# Patient Record
Sex: Female | Born: 1960 | Race: White | Hispanic: No | Marital: Married | Smoking: Never smoker
Health system: Southern US, Community
[De-identification: ages and names within clinical notes are randomized; demographics above are authoritative.]

## PROBLEM LIST (undated history)

## (undated) DIAGNOSIS — R112 Nausea with vomiting, unspecified: Secondary | ICD-10-CM

## (undated) DIAGNOSIS — C189 Malignant neoplasm of colon, unspecified: Secondary | ICD-10-CM

## (undated) DIAGNOSIS — L80 Vitiligo: Secondary | ICD-10-CM

## (undated) DIAGNOSIS — Z9889 Other specified postprocedural states: Secondary | ICD-10-CM

## (undated) HISTORY — PX: ABDOMINAL HYSTERECTOMY: SHX81

## (undated) HISTORY — PX: COLON SURGERY: SHX602

## (undated) HISTORY — PX: COLONOSCOPY: SHX174

---

## 2007-04-15 ENCOUNTER — Ambulatory Visit: Payer: Self-pay | Admitting: Family Medicine

## 2007-04-15 DIAGNOSIS — Z9189 Other specified personal risk factors, not elsewhere classified: Secondary | ICD-10-CM | POA: Insufficient documentation

## 2007-04-15 LAB — CONVERTED CEMR LAB
Blood in Urine, dipstick: NEGATIVE
Glucose, Urine, Semiquant: NEGATIVE
Protein, U semiquant: NEGATIVE
Urobilinogen, UA: 0.2
pH: 5.5

## 2007-04-17 LAB — CONVERTED CEMR LAB
Albumin: 3.9 g/dL (ref 3.5–5.2)
Alkaline Phosphatase: 48 units/L (ref 39–117)
Basophils Absolute: 0 10*3/uL (ref 0.0–0.1)
Bilirubin, Direct: 0.1 mg/dL (ref 0.0–0.3)
CO2: 31 meq/L (ref 19–32)
Creatinine, Ser: 0.7 mg/dL (ref 0.4–1.2)
Eosinophils Relative: 4 % (ref 0.0–5.0)
LDL Cholesterol: 93 mg/dL (ref 0–99)
MCV: 91.2 fL (ref 78.0–100.0)
Neutro Abs: 3.1 10*3/uL (ref 1.4–7.7)
Platelets: 255 10*3/uL (ref 150–400)
Potassium: 4.6 meq/L (ref 3.5–5.1)
RBC: 4.52 M/uL (ref 3.87–5.11)
Sodium: 140 meq/L (ref 135–145)
TSH: 2.02 microintl units/mL (ref 0.35–5.50)
Total Bilirubin: 0.6 mg/dL (ref 0.3–1.2)
Total CHOL/HDL Ratio: 3.6
Total Protein: 7 g/dL (ref 6.0–8.3)
VLDL: 8 mg/dL (ref 0–40)
WBC: 4.8 10*3/uL (ref 4.5–10.5)

## 2007-05-23 ENCOUNTER — Ambulatory Visit: Payer: Self-pay | Admitting: Family Medicine

## 2007-09-25 ENCOUNTER — Telehealth: Payer: Self-pay | Admitting: Family Medicine

## 2008-02-16 ENCOUNTER — Ambulatory Visit: Payer: Self-pay | Admitting: Family Medicine

## 2008-02-16 DIAGNOSIS — D259 Leiomyoma of uterus, unspecified: Secondary | ICD-10-CM | POA: Insufficient documentation

## 2008-02-16 DIAGNOSIS — K602 Anal fissure, unspecified: Secondary | ICD-10-CM | POA: Insufficient documentation

## 2008-03-30 ENCOUNTER — Encounter: Admission: RE | Admit: 2008-03-30 | Discharge: 2008-03-30 | Payer: Self-pay | Admitting: Obstetrics and Gynecology

## 2008-06-09 ENCOUNTER — Encounter: Admission: RE | Admit: 2008-06-09 | Discharge: 2008-06-09 | Payer: Self-pay | Admitting: Obstetrics and Gynecology

## 2009-08-10 ENCOUNTER — Encounter: Admission: RE | Admit: 2009-08-10 | Discharge: 2009-08-10 | Payer: Self-pay | Admitting: Obstetrics and Gynecology

## 2010-07-24 ENCOUNTER — Ambulatory Visit
Admission: RE | Admit: 2010-07-24 | Discharge: 2010-07-24 | Payer: Self-pay | Source: Home / Self Care | Attending: Family Medicine | Admitting: Family Medicine

## 2010-07-24 DIAGNOSIS — B029 Zoster without complications: Secondary | ICD-10-CM | POA: Insufficient documentation

## 2010-08-15 ENCOUNTER — Ambulatory Visit: Payer: Self-pay | Admitting: Cardiology

## 2010-08-15 ENCOUNTER — Other Ambulatory Visit: Payer: Self-pay | Admitting: Family Medicine

## 2010-08-15 ENCOUNTER — Ambulatory Visit
Admission: RE | Admit: 2010-08-15 | Discharge: 2010-08-15 | Payer: Self-pay | Source: Home / Self Care | Attending: Family Medicine | Admitting: Family Medicine

## 2010-08-15 DIAGNOSIS — R1031 Right lower quadrant pain: Secondary | ICD-10-CM | POA: Insufficient documentation

## 2010-08-15 LAB — CONVERTED CEMR LAB
Bilirubin Urine: NEGATIVE
Blood in Urine, dipstick: NEGATIVE
Ketones, urine, test strip: NEGATIVE
Specific Gravity, Urine: 1.02

## 2010-08-15 LAB — CBC WITH DIFFERENTIAL/PLATELET
Basophils Absolute: 0 10*3/uL (ref 0.0–0.1)
Lymphocytes Relative: 13.4 % (ref 12.0–46.0)
Monocytes Relative: 6.7 % (ref 3.0–12.0)
Platelets: 267 10*3/uL (ref 150.0–400.0)
RDW: 13.5 % (ref 11.5–14.6)
WBC: 6.2 10*3/uL (ref 4.5–10.5)

## 2010-08-15 LAB — BASIC METABOLIC PANEL
CO2: 28 mEq/L (ref 19–32)
Calcium: 8.7 mg/dL (ref 8.4–10.5)
Chloride: 106 mEq/L (ref 96–112)
Creatinine, Ser: 0.5 mg/dL (ref 0.4–1.2)
GFR: 138.29 mL/min (ref >60.00)
Glucose, Bld: 104 mg/dL — ABNORMAL HIGH (ref 70–99)
Sodium: 142 mEq/L (ref 135–145)

## 2010-08-15 LAB — HEPATIC FUNCTION PANEL
ALT: 13 U/L (ref 0–35)
AST: 15 U/L (ref 0–37)
Alkaline Phosphatase: 58 U/L (ref 39–117)
Total Bilirubin: 0.1 mg/dL — ABNORMAL LOW (ref 0.3–1.2)

## 2010-08-17 NOTE — Assessment & Plan Note (Signed)
Summary: EYE ISSUES//? SHINGLES//CCM   Vital Signs:  Patient profile:   50 year old female Weight:      149 pounds BMI:     26.92 O2 Sat:      98 % Temp:     98.5 degrees F Pulse rate:   106 / minute BP sitting:   150 / 82  (left arm)  Vitals Entered By: Pura Spice, RN (July 24, 2010 10:56 AM) CC: c/o rt eye swollen  tender behind rt ear concerned may ha ve shingles states has 'pimple-like bumps rt temporal area. "   History of Present Illness: Here for 2 days of swelling and burning around the right temple, forehead, and upper eyelid. Also has a few red pimples in this area. No eye pain or vision changes.   Allergies: 1)  ! Acetyl Salicylic Acid (Aspirin)  Past History:  Past Medical History: Reviewed history from 02/16/2008 and no changes required. SVD x 1 uterine fibroids, sees Dr. Esperanza Richters  Review of Systems  The patient denies anorexia, fever, weight loss, weight gain, vision loss, decreased hearing, hoarseness, chest pain, syncope, dyspnea on exertion, peripheral edema, prolonged cough, headaches, hemoptysis, abdominal pain, melena, hematochezia, severe indigestion/heartburn, hematuria, incontinence, genital sores, muscle weakness, suspicious skin lesions, transient blindness, difficulty walking, depression, unusual weight change, abnormal bleeding, enlarged lymph nodes, angioedema, breast masses, and testicular masses.    Physical Exam  General:  Well-developed,well-nourished,in no acute distress; alert,appropriate and cooperative throughout examination Head:  Normocephalic and atraumatic without obvious abnormalities. No apparent alopecia or balding. Eyes:  No corneal or conjunctival inflammation noted. EOMI. Perrla. Funduscopic exam benign, without hemorrhages, exudates or papilledema. Vision grossly normal. Ears:  External ear exam shows no significant lesions or deformities.  Otoscopic examination reveals clear canals, tympanic membranes are intact  bilaterally without bulging, retraction, inflammation or discharge. Hearing is grossly normal bilaterally. Nose:  External nasal examination shows no deformity or inflammation. Nasal mucosa are pink and moist without lesions or exudates. Mouth:  Oral mucosa and oropharynx without lesions or exudates.  Teeth in good repair. Neck:  No deformities, masses, or tenderness noted. Skin:  several scattered red vessicles and papules on the right temple and forehead Cervical Nodes:  No lymphadenopathy noted   Impression & Recommendations:  Problem # 1:  HERPES ZOSTER (ICD-053.9)  Complete Medication List: 1)  Prednisone (pak) 10 Mg Tabs (Prednisone) .... As directed for 12 days 2)  Valtrex 1 Gm Tabs (Valacyclovir hcl) .... Three times a day  Patient Instructions: 1)  Please schedule a follow-up appointment as needed . advised her to be seen immediately if she develops any eye pain or vision changes  Prescriptions: VALTREX 1 GM TABS (VALACYCLOVIR HCL) three times a day  #30 x 0   Entered and Authorized by:   Nelwyn Salisbury MD   Signed by:   Nelwyn Salisbury MD on 07/24/2010   Method used:   [facility] [address]       [address]       [place], [place]  [address]       Ph: [phone] or [phone]       Fax: [phone]   RxID:   [id number] PREDNISONE (PAK) 10 MG TABS (PREDNISONE) as directed for 12 days  #1 x 0   Entered and Authorized by:   Nelwyn Salisbury MD   Signed by:   Tera Mater  Clent Ridges MD on 07/24/2010   Method used:   [facility]        [facility] [address] (retail)       [address]       [place], [place]  [address]       Ph: [phone] or [phone]       Fax: [phone]   RxID:   [id number]    Orders Added: 1)  Est. Patient Level IV [31517]

## 2010-08-21 ENCOUNTER — Telehealth: Payer: Self-pay

## 2010-08-21 NOTE — Telephone Encounter (Signed)
Message copied by Madison Hickman on Mon Aug 21, 2010  8:10 AM ------      Message from: Dwaine Deter      Created: Caleen Essex Aug 18, 2010  5:52 PM       Normal except mild anemia. Take OTC iron pills bid . Recheck in 6 months

## 2010-08-21 NOTE — Telephone Encounter (Signed)
Pt aware of lab results 

## 2010-08-23 NOTE — Assessment & Plan Note (Signed)
Summary: pain in lower abdomen/periodic low grade fever/cjr   Vital Signs:  Patient profile:   50 year old female Weight:      148 pounds O2 Sat:      99 % Temp:     98.3 degrees F Pulse rate:   110 / minute BP sitting:   116 / 80  (left arm) Cuff size:   regular  Vitals Entered By: Pura Spice, RN (August 15, 2010 11:22 AM) CC: low grade fever x 2 days low back pain cramping  had menses yest none today    History of Present Illness: Here for 3 days of mild intermittent crampy pains in the RLQ, mild nausea, and low grade fevers to 100 degrees. Also has some mild lower back pains. Appetite is normal. No urinary burning or urgency. No change in BMs. She has had some traces of vaginal bleeding but she is not due to start her menses until next week. She has known uterine fibroids and is scheduled for a pelvic exam with Dr. Vickey Sages in April.   Allergies: 1)  ! Acetyl Salicylic Acid (Aspirin)  Past History:  Past Medical History: Reviewed history from 02/16/2008 and no changes required. SVD x 1 uterine fibroids, sees Dr. Esperanza Richters  Past Surgical History: Reviewed history from 04/15/2007 and no changes required. Denies surgical history  Review of Systems  The patient denies anorexia, weight loss, weight gain, vision loss, decreased hearing, hoarseness, chest pain, syncope, dyspnea on exertion, peripheral edema, prolonged cough, headaches, hemoptysis, melena, hematochezia, severe indigestion/heartburn, hematuria, incontinence, genital sores, muscle weakness, suspicious skin lesions, transient blindness, difficulty walking, depression, unusual weight change, abnormal bleeding, enlarged lymph nodes, angioedema, breast masses, and testicular masses.    Physical Exam  General:  Well-developed,well-nourished,in no acute distress; alert,appropriate and cooperative throughout examination Lungs:  Normal respiratory effort, chest expands symmetrically. Lungs are clear to auscultation,  no crackles or wheezes. Heart:  Normal rate and regular rhythm. S1 and S2 normal without gallop, murmur, click, rub or other extra sounds. Abdomen:  normal bowel sounds, no distention, no guarding, no rigidity, no rebound tenderness, no abdominal hernia, no inguinal hernia, no hepatomegaly, and no splenomegaly.  She has firm nontender masses from the umbilicus down to the pubis consistent with fibroids. The largest of these masses is in the RLQ. The RLQ itself is mildly tender.    Impression & Recommendations:  Problem # 1:  ABDOMINAL PAIN, RIGHT LOWER QUADRANT (ICD-789.03)  Orders: UA Dipstick w/o Micro (manual) ([address]) Venipuncture ([address]) TLB-BMP (Basic Metabolic Panel-BMET) (80048-METABOL) TLB-CBC Platelet - w/Differential (85025-CBCD) TLB-Hepatic/Liver Function Pnl (80076-HEPATIC) Radiology Referral (Radiology) Specimen Handling (99000) Specimen Handling (99000)  Problem # 2:  FIBROIDS, UTERUS (ICD-218.9)  Patient Instructions: 1)  This is most likely from a ruptured ovarian cyst, but we need to rule out appendicitis. The large fibroids make examination difficult. We will get labs and send her for a contrasted CT of abdomen and pelvis today    Orders Added: 1)  Est. Patient Level V [09811] 2)  UA Dipstick w/o Micro (manual) [81002] 3)  Venipuncture [36415] 4)  TLB-BMP (Basic Metabolic Panel-BMET) [80048-METABOL] 5)  TLB-CBC Platelet - w/Differential [85025-CBCD] 6)  TLB-Hepatic/Liver Function Pnl [80076-HEPATIC] 7)  Radiology Referral [Radiology] 8)  Specimen Handling [99000]    Laboratory Results   Urine Tests  Date/Time Received: August 15, 2010 11:47 AM  Date/Time Reported: .time  Routine Urinalysis   Color: yellow Appearance: Clear Glucose: negative   (Normal Range: Negative) Bilirubin: negative   (  Normal Range: Negative) Ketone: negative   (Normal Range: Negative) Spec. Gravity: 1.020   (Normal Range: 1.003-1.035) Blood: negative   (Normal Range:  Negative) pH: 5.0   (Normal Range: 5.0-8.0) Protein: negative   (Normal Range: Negative) Urobilinogen: 0.2   (Normal Range: 0-1) Nitrite: negative   (Normal Range: Negative) Leukocyte Esterace: negative   (Normal Range: Negative)    Comments: Pura Spice, RN  August 15, 2010 11:48 AM

## 2010-09-13 ENCOUNTER — Ambulatory Visit (HOSPITAL_COMMUNITY): Admission: RE | Admit: 2010-09-13 | Payer: 59 | Source: Ambulatory Visit | Admitting: Obstetrics and Gynecology

## 2010-10-31 ENCOUNTER — Encounter (HOSPITAL_COMMUNITY)
Admission: RE | Admit: 2010-10-31 | Discharge: 2010-10-31 | Disposition: A | Discharge status: Home / Self Care | Payer: 59 | Source: Ambulatory Visit | Attending: Obstetrics and Gynecology | Admitting: Obstetrics and Gynecology

## 2010-10-31 DIAGNOSIS — Z01818 Encounter for other preprocedural examination: Secondary | ICD-10-CM | POA: Insufficient documentation

## 2010-10-31 DIAGNOSIS — Z01812 Encounter for preprocedural laboratory examination: Secondary | ICD-10-CM | POA: Insufficient documentation

## 2010-10-31 LAB — SURGICAL PCR SCREEN
MRSA, PCR: NEGATIVE
Staphylococcus aureus: POSITIVE — AB

## 2010-11-07 ENCOUNTER — Inpatient Hospital Stay (HOSPITAL_COMMUNITY)
Admission: RE | Admit: 2010-11-07 | Discharge: 2010-11-09 | DRG: 743 | Disposition: A | Discharge status: Home / Self Care | Payer: 59 | Source: Ambulatory Visit | Attending: Obstetrics and Gynecology | Admitting: Obstetrics and Gynecology

## 2010-11-07 ENCOUNTER — Other Ambulatory Visit (HOSPITAL_COMMUNITY): Payer: Self-pay | Admitting: Obstetrics and Gynecology

## 2010-11-07 DIAGNOSIS — D251 Intramural leiomyoma of uterus: Principal | ICD-10-CM | POA: Diagnosis present

## 2010-11-07 DIAGNOSIS — N9489 Other specified conditions associated with female genital organs and menstrual cycle: Secondary | ICD-10-CM | POA: Diagnosis present

## 2010-11-07 DIAGNOSIS — N838 Other noninflammatory disorders of ovary, fallopian tube and broad ligament: Secondary | ICD-10-CM | POA: Diagnosis present

## 2010-11-07 DIAGNOSIS — D252 Subserosal leiomyoma of uterus: Secondary | ICD-10-CM | POA: Diagnosis present

## 2010-11-07 LAB — ABO/RH: ABO/RH(D): B POS

## 2010-11-07 LAB — TYPE AND SCREEN
ABO/RH(D): B POS
Antibody Screen: NEGATIVE

## 2010-11-07 LAB — PREGNANCY, URINE: Preg Test, Ur: NEGATIVE

## 2010-11-08 LAB — CBC
HCT: 31.8 % — ABNORMAL LOW (ref 36.0–46.0)
MCV: 83.5 fL (ref 78.0–100.0)
RBC: 3.81 MIL/uL — ABNORMAL LOW (ref 3.87–5.11)
WBC: 9.7 10*3/uL (ref 4.0–10.5)

## 2010-12-07 NOTE — Op Note (Signed)
Penny Barnes, Penny Barnes NO.:  [id number]  MEDICAL RECORD NO.:  [id number]           PATIENT TYPE:  I  LOCATION:  9304                          FACILITY:  [facility]  PHYSICIAN:  Zelphia Cairo, MD    DATE OF BIRTH:  [birth year]  DATE OF PROCEDURE:  11/07/2010 DATE OF DISCHARGE:                              OPERATIVE REPORT   PREOPERATIVE DIAGNOSIS:  Fibroid uterus.  POSTOPERATIVE DIAGNOSES: 1. Fibroid uterus. 2. Enlarged left ovary adherent to the uterus.  PROCEDURE:  Total abdominal hysterectomy with left salpingo- oophorectomy.  SURGEON:  Zelphia Cairo, MD  ASSISTANT:  Dineen Kid. Rana Snare, MD  ANESTHESIA:  General.  COMPLICATIONS:  None.  BLOOD LOSS:  100 mL.  URINE OUTPUT:  Clear.  PROCEDURE:  Risks, benefits, indications and alternatives were discussed with Penny Barnes and informed consent was obtained.  The patient was taken to the operating room with an IV running.  She was placed in the supine position given general anesthesia, prepped and draped in sterile fashion and a Foley catheter was inserted sterilely.  A Pfannenstiel skin incision was made 2 cm above the symphysis pubis.  The fascia was incised in the midline and extended laterally using curved Mayo scissors.  The muscles of the anterior abdominal wall were separated and the peritoneum was identified and entered sharply with Metzenbaum scissors.  This was extended.  A survey of the pelvis revealed enlarged uterus with pedunculated fibroid at the fundus.  What appeared at the time to be a fibroid in the left broad ligament.  Bilateral round ligaments were clamped, transected, and suture ligated.  The anterior leaf of the broad ligament was incised along the bladder reflection to the midline from both sides and the bladder was gently dissected off of the lower uterine segment and the cervix using a sponge stick.  The left cornua was grasped with Heaney clamps, transected, and suture ligated. The broad  ligament on the left was serially clamped, transected, and suture ligated staying just adjacent to the mass protruding from the left uterine wall.  The uterine arteries were then skeletonized on the left.  Clamp transected and suture ligated.  Once hemostasis was assured, our attention was turned to the right.  The right utero-ovarian ligament was clamped, transected, and suture ligated.  The uterine artery was skeletonized and clamped with Heaney clamp, transected and suture ligated.  Next, the uterus was amputated from the cervix and the cervical stump was grasped with Kocher clamps.  An O'Connor-O'Sullivan self-retaining retractor was then placed in the abdomen and the bowel was packed with moist sponges.  The uterosacral ligaments were clamped bilaterally, transected, and suture ligated.  The cervix was then amputated from the vaginal cuff.  The vaginal cuff angles were closed using figure-of-eight stitches of 0-0 Vicryl.  The remainder of the vaginal cuff was closed using figure-of-eight stitches of 0 Vicryl. Hemostasis was assured and the pelvis was copiously irrigated with warm normal saline.  An inspection, the left ovary could not be identified. The uterus was sent for gross pathology examination.  The pathologist could identify the left fallopian tube, but could  not discern definitively at that time if the mass adhered to the left uterine sidewall was ovarian tissue or fibroids.  The right ovary and fallopian tube appeared normal.  All pedicles were reinspected and found to be hemostatic.  The vaginal cuff was hemostatic.  All instruments were removed from the pelvis.  Sponge lap, needle and instrument counts were correct x2.  The patient was taken to the PACU, awake and in stable condition.     Zelphia Cairo, MD     GA/MEDQ  D:  11/07/2010  T:  11/08/2010  Job:  [id number]  Electronically Signed by Zelphia Cairo MD on 12/07/2010 09:47:33 AM

## 2013-07-06 ENCOUNTER — Encounter: Payer: Self-pay | Admitting: Family Medicine

## 2013-07-06 ENCOUNTER — Ambulatory Visit (INDEPENDENT_AMBULATORY_CARE_PROVIDER_SITE_OTHER): Payer: 59 | Admitting: Family Medicine

## 2013-07-06 VITALS — BP 140/82 | Temp 98.1°F | Wt 145.0 lb

## 2013-07-06 DIAGNOSIS — K625 Hemorrhage of anus and rectum: Secondary | ICD-10-CM

## 2013-07-06 NOTE — Progress Notes (Signed)
Pre visit review using our clinic review tool, if applicable. No additional management support is needed unless otherwise documented below in the visit note. 

## 2013-07-06 NOTE — Progress Notes (Addendum)
Chief Complaint  Patient presents with  . Rectal Bleeding    had a horrible stomach ache Friday night, bloody with mucus    HPI:  Penny Barnes is a 52 yo pt of doctor Clent Ridges here for an acute visit for:  BRBPR: -started: 3 days ago     -symptoms: has chronic issues with constipation and cramping, and had some cramping in abd (lower difuse) 3 nights ago, had one episode of diarrhea and thought had blood in it, has had some log shaped stools daily since that have had what looks like blood and maybe mucus on the stools, but not on the TP really when she wipes -improving and no abd pain since, ? Uncomfortable sensation in lower abd - not pain, more like bloating -denies:fevers, nausea, vomiting, rectal pain, hemorrhoids, urinary symptoms, foreign travel, antibiotics, poor appetite, weight loss -hx of anal fissure -reports chronic GI issues, missed her colonoscopy and wants to do this  ROS: See pertinent positives and negatives per HPI.  No past medical history on file.  No past surgical history on file.  No family history on file.  History   Social History  . Marital Status: Married    Spouse Name: N/A    Number of Children: N/A  . Years of Education: N/A   Social History Main Topics  . Smoking status: Never Smoker   . Smokeless tobacco: None  . Alcohol Use: Yes     Comment: occ  . Drug Use: None  . Sexual Activity: None   Other Topics Concern  . None   Social History Narrative  . None    No current outpatient prescriptions on file.  EXAMCeasar Mons Vitals:   07/06/13 1422  BP: 140/82  Temp: 98.1 F (36.7 C)    Body mass index is 26.08 kg/(m^2).  GENERAL: vitals reviewed and listed above, alert, oriented, appears well hydrated and in no acute distress  HEENT: atraumatic, conjunttiva clear, no obvious abnormalities on inspection of external nose and ears  NECK: no obvious masses on inspection  LUNGS: clear to auscultation bilaterally, no wheezes, rales or rhonchi, good  air movement  CV: HRRR, no peripheral edema  ABD: BS+, soft, NTTP, no rebound or guarding  RECTAL: no lesions, no BRB on exam glove, hemocult +  MS: moves all extremities without noticeable abnormality  PSYCH: pleasant and cooperative, no obvious depression or anxiety  ASSESSMENT AND PLAN:  Discussed the following assessment and plan:  Rectal bleeding - Plan: POCT hemoglobin, Ambulatory referral to Gastroenterology  -chronic GI issues and acute BRBPR which is improving over last few days -stable hgb, no pain, benign abd exam - hemocult + -we discussed possible serious and likely etiologies (including but not limited to infectious, inflammatory, diverticulitis, appendicitis, ischemic and vascular and other) workup and treatment including possible CT/labs today versus seeing GI, treatment risks and return precautions -after this discussion, Penny Barnes opted for referral to GI given her chronic issues, her desire to have colonoscopy and holding off on testing until seeing them given she feels well today -follow up advised as needed -of course, we advised Penny Barnes  to return or notify a doctor immediately if symptoms worsen or persist or new concerns arise.  .  -had some cramping and diarrhea several days ago but that has resolved and no longer has any pain  -Patient advised to return or notify a doctor immediately if symptoms worsen or persist or new concerns arise.  There are no Patient Instructions on file for this  visit.   Colin Benton R.

## 2013-07-08 ENCOUNTER — Other Ambulatory Visit (INDEPENDENT_AMBULATORY_CARE_PROVIDER_SITE_OTHER): Payer: 59

## 2013-07-08 ENCOUNTER — Encounter: Payer: Self-pay | Admitting: Gastroenterology

## 2013-07-08 ENCOUNTER — Ambulatory Visit (INDEPENDENT_AMBULATORY_CARE_PROVIDER_SITE_OTHER): Payer: 59 | Admitting: Gastroenterology

## 2013-07-08 VITALS — BP 128/80 | HR 96 | Ht 62.25 in | Wt 142.1 lb

## 2013-07-08 DIAGNOSIS — K625 Hemorrhage of anus and rectum: Secondary | ICD-10-CM

## 2013-07-08 DIAGNOSIS — R109 Unspecified abdominal pain: Secondary | ICD-10-CM

## 2013-07-08 LAB — CBC WITH DIFFERENTIAL/PLATELET
Basophils Absolute: 0 10*3/uL (ref 0.0–0.1)
HCT: 42.5 % (ref 36.0–46.0)
Hemoglobin: 14.6 g/dL (ref 12.0–15.0)
Lymphs Abs: 0.8 10*3/uL (ref 0.7–4.0)
MCHC: 34.3 g/dL (ref 30.0–36.0)
MCV: 86.7 fl (ref 78.0–100.0)
Monocytes Absolute: 0.3 10*3/uL (ref 0.1–1.0)
Monocytes Relative: 7 % (ref 3.0–12.0)
Neutro Abs: 2.5 10*3/uL (ref 1.4–7.7)
Platelets: 229 10*3/uL (ref 150.0–400.0)
RBC: 4.9 Mil/uL (ref 3.87–5.11)

## 2013-07-08 LAB — COMPREHENSIVE METABOLIC PANEL
ALT: 14 U/L (ref 0–35)
Albumin: 4.3 g/dL (ref 3.5–5.2)
CO2: 29 mEq/L (ref 19–32)
Calcium: 9.5 mg/dL (ref 8.4–10.5)
Chloride: 107 mEq/L (ref 96–112)
GFR: 106.69 mL/min (ref >60.00)
Glucose, Bld: 112 mg/dL — ABNORMAL HIGH (ref 70–99)
Potassium: 4.3 mEq/L (ref 3.5–5.1)
Sodium: 142 mEq/L (ref 135–145)
Total Protein: 7.8 g/dL (ref 6.0–8.3)

## 2013-07-08 MED ORDER — HYDROCORTISONE ACETATE 25 MG RE SUPP: 25.0000 mg | Freq: Two times a day (BID) | RECTAL | Status: DC

## 2013-07-08 MED ORDER — HYOSCYAMINE SULFATE 0.125 MG SL SUBL: 0.1250 mg | SUBLINGUAL_TABLET | SUBLINGUAL | Status: DC | PRN

## 2013-07-08 MED ORDER — MOVIPREP 100 G PO SOLR: 1.0000 | ORAL | Status: DC

## 2013-07-08 NOTE — Patient Instructions (Addendum)
Please go to the basement level to have your labs drawn.  We sent prescriptions to [facility] [address]. 1. Anusol HC Suppositories 2. Levsin tablets Take 1 dose of Miralax ( 17 grams ) daily.  You have been scheduled for a colonoscopy with propofol. Please follow written instructions given to you at your visit today.  Please pick up your prep kit at the pharmacy within the next 1-3 days. If you use inhalers (even only as needed), please bring them with you on the day of your procedure. Jeannie Fend

## 2013-07-13 ENCOUNTER — Encounter: Payer: Self-pay | Admitting: Gastroenterology

## 2013-07-13 DIAGNOSIS — R109 Unspecified abdominal pain: Secondary | ICD-10-CM | POA: Insufficient documentation

## 2013-07-13 DIAGNOSIS — K625 Hemorrhage of anus and rectum: Secondary | ICD-10-CM | POA: Insufficient documentation

## 2013-07-13 NOTE — Progress Notes (Signed)
Reviewed and agree with management plan.  Moorea Boissonneault T. Kedrick Mcnamee, MD FACG 

## 2013-07-13 NOTE — Progress Notes (Signed)
07/13/2013 Penny Barnes [id number] 11/24/1960   HISTORY OF PRESENT ILLNESS:  This is a pleasant 52 year old female who is new to our practice.  She comes in today at the request of her PCP.  She reports that last Friday she developed sudden onset of mid-abdominal cramps.  This was followed by diarrhea, which then ultimately turned to bloody stools.  Never had any similar symptoms in the past.  The diarrhea and abdominal cramps have stopped.  Still has some abdominal soreness and gets occasional quick waves of pain, however.  No fever, chills, nausea, or vomiting.  Now has not had any BM's since the diarrhea stopped.  Tends to be constipated for the most part as baseline.  Appetite has been good throughout this recent issue.  Never saw GI in the past and has never undergone colonoscopy.  She saw her PCP who performed a rectal exam and the patient was told that she still had microscopic blood present.  No labs were drawn.    History reviewed. No pertinent past medical history. Past Surgical History  Procedure Laterality Date  . Abdominal hysterectomy      have one ovary    reports that she has never smoked. She has never used smokeless tobacco. She reports that she drinks alcohol. She reports that she does not use illicit drugs. family history includes Diabetes in her father; Diverticulosis in her mother; Heart disease in her father; Kidney Stones in her mother; Thyroid disease in her mother. Allergies  Allergen Reactions  . Aspirin       Outpatient Encounter Prescriptions as of 07/08/2013  Medication Sig  . hydrocortisone (ANUSOL-HC) 25 MG suppository Place 1 suppository (25 mg total) rectally 2 (two) times daily.  . hyoscyamine (LEVSIN/SL) 0.125 MG SL tablet Place 1 tablet (0.125 mg total) under the tongue every 4 (four) hours as needed.  Marland Kitchen MOVIPREP 100 G SOLR Take 1 kit (200 g total) by mouth as directed.     REVIEW OF SYSTEMS  : All other systems reviewed and negative except where  noted in the History of Present Illness.   PHYSICAL EXAM: BP 128/80  Pulse 96  Ht 5' 2.25" (1.581 m)  Wt 142 lb 2 oz (64.467 kg)  BMI 25.79 kg/m2 General: Well developed white female in no acute distress Head: Normocephalic and atraumatic Eyes:  Sclerae anicteric, conjunctiva pink. Ears: Normal auditory acuity.  Lungs: Clear throughout to auscultation. Heart: Regular rate and rhythm Abdomen: Soft, non-distended.  BS present.  Non-tender.    Rectal:  Deferred.  Was performed by PCP and was reportedly heme positive; will be performed again at upcoming colonoscopy. Musculoskeletal: Symmetrical with no gross deformities  Skin: No lesions on visible extremities Extremities: No edema  Neurological: Alert oriented x 4, grossly non-focal. Psychological:  Alert and cooperative. Normal mood and affect.  ASSESSMENT AND PLAN: -Lower gastrointestinal bleeding:  ? Outlet bleeding or related to recent issue with diarrhea and abdominal pain.  Diarrhea and abdominal pain have resolved, however, continues to have heme positive stools.  ? If acute diarrheal illness was an episode of infectious vs ischemic colitis (she does not really have any risk factors for ischemia, however). -Constipation:  Has baseline constipation.  *Will check CBC and CMP today. *Schedule colonoscopy for evaluation.  The risks, benefits, and alternatives were discussed with the patient and she consents to proceed.   *Will use hydrocortisone suppositories one or two times daily in the interim. *Now that the acute diarrhea issue has resolved she  should start taking Miralax daily for her baseline constipation.   *Will give prescription for levsin to use prn for any further abdominal cramping.

## 2013-07-15 ENCOUNTER — Ambulatory Visit: Payer: 59 | Admitting: Physician Assistant

## 2013-07-24 ENCOUNTER — Encounter: Payer: Self-pay | Admitting: Gastroenterology

## 2013-07-24 ENCOUNTER — Other Ambulatory Visit: Payer: Self-pay

## 2013-07-24 ENCOUNTER — Ambulatory Visit (AMBULATORY_SURGERY_CENTER): Payer: 59 | Admitting: Gastroenterology

## 2013-07-24 VITALS — BP 125/62 | HR 96 | Temp 97.1°F | Resp 16 | Ht 62.25 in | Wt 142.0 lb

## 2013-07-24 DIAGNOSIS — K921 Melena: Secondary | ICD-10-CM

## 2013-07-24 DIAGNOSIS — K6389 Other specified diseases of intestine: Secondary | ICD-10-CM

## 2013-07-24 DIAGNOSIS — D126 Benign neoplasm of colon, unspecified: Secondary | ICD-10-CM

## 2013-07-24 DIAGNOSIS — K625 Hemorrhage of anus and rectum: Secondary | ICD-10-CM

## 2013-07-24 MED ORDER — SODIUM CHLORIDE 0.9 % IV SOLN: 500.0000 mL | INTRAVENOUS | Status: DC

## 2013-07-24 NOTE — Op Note (Signed)
[place]  [address]. [place], [address]   COLONOSCOPY PROCEDURE REPORT PATIENT: Penny Barnes, Penny Barnes  MR#: [id number] BIRTHDATE: [birth year] , 36  yrs. old GENDER: Female ENDOSCOPIST: Ladene Artist, MD, [facility] REFERRED WG:NFAOZHY Raymon Mutton, M.D. PROCEDURE DATE:  07/24/2013 PROCEDURE:   Colonoscopy with biopsy and snare polypectomy and Submucosal injection, any substance First Screening Colonoscopy - Avg.  risk and is 50 yrs.  old or older - No.  Prior Negative Screening - Now for repeat screening. N/A  History of Adenoma - Now for follow-up colonoscopy & has been > or = to 3 yrs.  N/A  Polyps Removed Today? Yes. ASA CLASS:   Class II INDICATIONS:hematochezia. MEDICATIONS: MAC sedation, administered by CRNA and propofol (Diprivan) 290mg  IV DESCRIPTION OF PROCEDURE:   After the risks benefits and alternatives of the procedure were thoroughly explained, informed consent was obtained.  A digital rectal exam revealed no abnormalities of the rectum.   The LB [id number]  endoscope was introduced through the anus and advanced to the cecum, which was identified by both the appendix and ileocecal valve. No adverse events experienced.   The quality of the prep was good, using MoviPrep  The instrument was then slowly withdrawn as the colon was fully examined.  COLON FINDINGS: A one-third circumferential firm and ulcerated 2.5 X 3 cm mass with friable surfaces was found at 20-25 cm in the sigmoid colon.  Multiple biopsies of the lesion were performed.  2 tattoos were applied: 1 proximal and 1 distal to the lesion.  Mild diverticulosis was noted in the descending colon.  A sessile polyp measuring 5 mm in size was found in the rectum.  A polypectomy was performed with a cold snare.  The resection was complete and the polyp tissue was completely retrieved.   The colon was otherwise normal.  There was no diverticulosis, inflammation, polyps or cancers unless previously stated.   Retroflexed views revealed small internal hemorrhoids. The time to cecum=3 minutes 56 seconds. Withdrawal time=13 minutes 51 seconds.  The scope was withdrawn and the procedure completed. COMPLICATIONS: There were no complications. ENDOSCOPIC IMPRESSION: 1.   Mass in the sigmoid colon; multiple biopsies performed; a tattoo was applied 2.   Mild diverticulosis was noted in the descending colon 3.   Sessile polyp measuring 5 mm in the rectum; polypectomy performed with a cold snare 4.   Small internal hemorrhoids  RECOMMENDATIONS: 1.  Await pathology results 2.  My office will arrange for you to have a CT scan of abdomen and pelvis. 3.  My office will arrange for you to meet with a surgeon.  eSigned:  Ladene Artist, MD, [facility] 07/24/2013 4:13 PM

## 2013-07-24 NOTE — Progress Notes (Signed)
Patient's specimens sent  "Rush."

## 2013-07-24 NOTE — Patient Instructions (Signed)
YOU HAD AN ENDOSCOPIC PROCEDURE TODAY AT THE [facility]: Refer to the procedure report that was given to you for any specific questions about what was found during the examination.  If the procedure report does not answer your questions, please call your gastroenterologist to clarify.  If you requested that your care partner not be given the details of your procedure findings, then the procedure report has been included in a sealed envelope for you to review at your convenience later.  YOU SHOULD EXPECT: Some feelings of bloating in the abdomen. Passage of more gas than usual.  Walking can help get rid of the air that was put into your GI tract during the procedure and reduce the bloating. If you had a lower endoscopy (such as a colonoscopy or flexible sigmoidoscopy) you may notice spotting of blood in your stool or on the toilet paper. If you underwent a bowel prep for your procedure, then you may not have a normal bowel movement for a few days.  DIET: Your first meal following the procedure should be a light meal and then it is ok to progress to your normal diet.  A half-sandwich or bowl of soup is an example of a good first meal.  Heavy or fried foods are harder to digest and may make you feel nauseous or bloated.  Likewise meals heavy in dairy and vegetables can cause extra gas to form and this can also increase the bloating.  Drink plenty of fluids but you should avoid alcoholic beverages for 24 hours.  ACTIVITY: Your care partner should take you home directly after the procedure.  You should plan to take it easy, moving slowly for the rest of the day.  You can resume normal activity the day after the procedure however you should NOT DRIVE or use heavy machinery for 24 hours (because of the sedation medicines used during the test).    SYMPTOMS TO REPORT IMMEDIATELY: A gastroenterologist can be reached at any hour.  During normal business hours, 8:30 AM to 5:00 PM Monday through Friday,  call [phone].  After hours and on weekends, please call the GI answering service at [phone] who will take a message and have the physician on call contact you.   Following lower endoscopy (colonoscopy or flexible sigmoidoscopy):  Excessive amounts of blood in the stool  Significant tenderness or worsening of abdominal pains  Swelling of the abdomen that is new, acute  Fever of 100F or higher    FOLLOW UP: If any biopsies were taken you will be contacted by phone or by letter within the next 1-3 weeks.  Call your gastroenterologist if you have not heard about the biopsies in 3 weeks.  Our staff will call the home number listed on your records the next business day following your procedure to check on you and address any questions or concerns that you may have at that time regarding the information given to you following your procedure. This is a courtesy call and so if there is no answer at the home number and we have not heard from you through the emergency physician on call, we will assume that you have returned to your regular daily activities without incident.  SIGNATURES/CONFIDENTIALITY: You and/or your care partner have signed paperwork which will be entered into your electronic medical record.  These signatures attest to the fact that that the information above on your After Visit Summary has been reviewed and is understood.  Full responsibility of the confidentiality   of this discharge information lies with you and/or your care-partner.   DR Lynne Leader OFFICE WILL CONTACT YOU WITH DETAILS FOR CT SCAN ,CONTRAST WAS GIVEN TO YOU TODAY AND INSTRUCTIONS & TIME TO DRINK IT WILL BE GIVEN TO YOU BY DR Lynne Leader NURSE,THEY WILL ALSO SET YOU UP TO SEE A SURGEON.

## 2013-07-24 NOTE — Progress Notes (Signed)
Called to room to assist during endoscopic procedure.  Patient ID and intended procedure confirmed with present staff. Received instructions for my participation in the procedure from the performing physician.  

## 2013-07-24 NOTE — Progress Notes (Signed)
No egg or soy allergy. ewm No problems with past sedation. ewm 

## 2013-07-24 NOTE — Progress Notes (Signed)
Procedure ends, to recovery, report given and VSS. 

## 2013-07-24 NOTE — Progress Notes (Signed)
Penny Barnes IN TO TALK WITH PT & HUSBAND WITH DETAILS FOR CT SCAN & SURGEON CONSULT, CONTRAST GIVEN TO PT

## 2013-07-27 ENCOUNTER — Ambulatory Visit (INDEPENDENT_AMBULATORY_CARE_PROVIDER_SITE_OTHER)
Admission: RE | Admit: 2013-07-27 | Discharge: 2013-07-27 | Disposition: A | Discharge status: Home / Self Care | Payer: 59 | Source: Ambulatory Visit | Attending: Gastroenterology | Admitting: Gastroenterology

## 2013-07-27 ENCOUNTER — Telehealth: Payer: Self-pay | Admitting: *Deleted

## 2013-07-27 ENCOUNTER — Other Ambulatory Visit: Payer: 59

## 2013-07-27 DIAGNOSIS — K6389 Other specified diseases of intestine: Secondary | ICD-10-CM

## 2013-07-27 MED ORDER — IOHEXOL 300 MG/ML  SOLN
100.0000 mL | Freq: Once | INTRAMUSCULAR | Status: AC | PRN
  Administered 2013-07-27: 100 mL via INTRAVENOUS

## 2013-07-27 NOTE — Telephone Encounter (Signed)
  Follow up Call-  Call back number 07/24/2013 07/24/2013  Post procedure Call Back phone  # [phone]  Permission to leave phone message - Yes     Patient questions:  Do you have a fever, pain , or abdominal swelling? no Pain Score  0 *  Have you tolerated food without any problems? yes  Have you been able to return to your normal activities? yes  Do you have any questions about your discharge instructions: Diet   no Medications  no Follow up visit  no  Do you have questions or concerns about your Care? no  Actions: * If pain score is 4 or above: No action needed, pain <4.

## 2013-07-28 ENCOUNTER — Telehealth: Payer: Self-pay | Admitting: Gastroenterology

## 2013-07-28 NOTE — Telephone Encounter (Signed)
Pt called and wanted to know what stage she is per her path report. Discussed with pt that other factors along with the path report are looked at the officially stage the cancer. Let her know the surgeon will be the one to give her this information.

## 2013-08-06 ENCOUNTER — Ambulatory Visit (INDEPENDENT_AMBULATORY_CARE_PROVIDER_SITE_OTHER): Payer: 59 | Admitting: General Surgery

## 2013-08-06 ENCOUNTER — Encounter (INDEPENDENT_AMBULATORY_CARE_PROVIDER_SITE_OTHER): Payer: Self-pay | Admitting: General Surgery

## 2013-08-06 VITALS — BP 138/88 | HR 72 | Temp 98.3°F | Resp 14 | Ht 63.0 in | Wt 142.6 lb

## 2013-08-06 DIAGNOSIS — C187 Malignant neoplasm of sigmoid colon: Secondary | ICD-10-CM

## 2013-08-06 NOTE — Progress Notes (Signed)
Subjective:   new diagnosis cancer of the sigmoid colon  Patient ID: Penny Barnes, female   DOB: 03/29/1961, 52 y.o.   MRN: [id number]  HPI Patient is a very pleasant 52-year-old female, followed by Dr. Fry and and referred by Dr. Stark 4 new diagnosis of adenocarcinoma of the colon. Just before the holidays she developed some abdominal cramping and diarrhea and then soon after noted blood in her stools. The cramping and diarrhea stopped after about 24 hours but she continued to have some intermittent blood with her bowel movements. She saw Dr. Fry and was referred for colonoscopy. Dr. Stark performed colonoscopy and I have the report and photographs. This shows an ulcerated 3 cm mass over about one third circumference of the sigmoid colon at 20-25 cm. Nonobstructive. Biopsies were obtained. This has revealed adenocarcinoma. The patient has no previous significant GI history. She subsequently has had CT scans of the chest abdomen and pelvis which shows no findings to suggest metastatic disease and a mass in the sigmoid is not apparent.  History reviewed. No pertinent past medical history. Past Surgical History  Procedure Laterality Date  . Abdominal hysterectomy      have one ovary   Current Outpatient Prescriptions  Medication Sig Dispense Refill  . hyoscyamine (LEVSIN/SL) 0.125 MG SL tablet Place 1 tablet (0.125 mg total) under the tongue every 4 (four) hours as needed.  10 tablet  0   No current facility-administered medications for this visit.   Allergies  Allergen Reactions  . Aspirin Hives     Review of Systems  Constitutional: Negative.   Respiratory: Negative.   Cardiovascular: Negative.   Gastrointestinal: Positive for blood in stool. Negative for nausea, abdominal pain, diarrhea, abdominal distention and rectal pain.  Genitourinary: Negative.   Musculoskeletal: Negative.        Objective:   Physical Exam BP 138/88  Pulse 72  Temp(Src) 98.3 F (36.8 C) (Temporal)   Resp 14  Ht 5' 3" (1.6 m)  Wt 142 lb 9.6 oz (64.683 kg)  BMI 25.27 kg/m2 General: Alert, well-developed Caucasian female, in no distress Skin: Warm and dry without rash or infection. HEENT: No palpable masses or thyromegaly. Sclera nonicteric. Pupils equal round and reactive. Oropharynx clear. Lymph nodes: No cervical, supraclavicular, or inguinal nodes palpable. Lungs: Breath sounds clear and equal without increased work of breathing Cardiovascular: Regular rate and rhythm without murmur. No JVD or edema. Peripheral pulses intact. Abdomen: Nondistended. Soft and nontender. No masses palpable. No organomegaly. No palpable hernias. Rectal: No mass or tenderness. Normal tone. Soft brown stool without blood noted. Extremities: No edema or joint swelling or deformity. No chronic venous stasis changes. Neurologic: Alert and fully oriented. Gait normal.    Assessment:     New diagnosis adenocarcinoma of the sigmoid colon. This hopefully s an early stage lesion. Had a long discussion with the patient and her husband regarding the diagnosis and its treatment. I have recommended proceeding with laparoscopic sigmoid colectomy. We discussed the nature and indications of the procedure in detail as well as expected recovery. We discussed that additional therapy would depend on final pathology results. They were given literature regarding the procedure. The rest of the procedure including anesthetic complications, bleeding, infection, anastomotic leak and possible need for larger incision or ostomy were discussed and understood and all questions answered.    Plan:     Will proceed with planning for laparoscopic sigmoid colectomy with a.m. Admission. Antibiotic and mechanical bowel prep were given.      

## 2013-08-13 ENCOUNTER — Ambulatory Visit (INDEPENDENT_AMBULATORY_CARE_PROVIDER_SITE_OTHER): Payer: 59 | Admitting: General Surgery

## 2013-08-13 ENCOUNTER — Encounter (INDEPENDENT_AMBULATORY_CARE_PROVIDER_SITE_OTHER): Payer: Self-pay | Admitting: General Surgery

## 2013-08-13 VITALS — BP 120/86 | HR 84 | Temp 98.0°F | Resp 14 | Ht 63.0 in | Wt 143.2 lb

## 2013-08-13 DIAGNOSIS — C187 Malignant neoplasm of sigmoid colon: Secondary | ICD-10-CM

## 2013-08-13 NOTE — Progress Notes (Signed)
Patient ID: Penny Barnes, female   DOB: February 10, 1961, 53 y.o.   MRN: [id number]  Chief Complaint  Patient presents with  . New Evaluation    2nd opinion eval colonic mass    HPI Penny Barnes is a 53 y.o. female.  She is referred to me by Dr. Hunt Oris for a second opinion regarding adenocarcinoma of the sigmoid colon. She is tentatively scheduled for surgery in February  Please see Dr. Hunt Oris detailed note from January 22. This is a healthy woman who had some abdominal cramping diarrhea and blood in her stools. This has resolved. Colonoscopy showed an ulcerated 3 cm mass in the sigmoid colon at 20-25 cm. CT scan of the chest abdomen and pelvis were negative. Orally prior medical problems are abdominal hysterectomy and what with one ovary removed through a Pfannenstiel incision. Family history is negative for colorectal neoplasia.  HPI  History reviewed. No pertinent past medical history.  Past Surgical History  Procedure Laterality Date  . Abdominal hysterectomy      have one ovary    Family History  Problem Relation Age of Onset  . Diverticulosis Mother   . Kidney Stones Mother   . Thyroid disease Mother   . Diabetes Father   . Heart disease Father   . Colon cancer Neg Hx   . Rectal cancer Neg Hx   . Stomach cancer Neg Hx   . Cancer Sister     thyroid  . Cancer Maternal Grandmother     breast    Social History History  Substance Use Topics  . Smoking status: Never Smoker   . Smokeless tobacco: Never Used  . Alcohol Use: No     Comment: social    Allergies  Allergen Reactions  . Aspirin Hives    Current Outpatient Prescriptions  Medication Sig Dispense Refill  . hyoscyamine (LEVSIN/SL) 0.125 MG SL tablet Place 1 tablet (0.125 mg total) under the tongue every 4 (four) hours as needed.  10 tablet  0   No current facility-administered medications for this visit.    Review of Systems Review of Systems  Constitutional: Negative for fever, chills and unexpected  weight change.  HENT: Negative for congestion, hearing loss, sore throat, trouble swallowing and voice change.   Eyes: Negative for visual disturbance.  Respiratory: Negative for cough and wheezing.   Cardiovascular: Negative for chest pain, palpitations and leg swelling.  Gastrointestinal: Positive for abdominal pain, diarrhea and blood in stool. Negative for nausea, vomiting, constipation, abdominal distention and anal bleeding.  Genitourinary: Negative for hematuria, vaginal bleeding and difficulty urinating.  Musculoskeletal: Negative for arthralgias.  Skin: Negative for rash and wound.  Neurological: Negative for seizures, syncope and headaches.  Hematological: Negative for adenopathy. Does not bruise/bleed easily.  Psychiatric/Behavioral: Negative for confusion. The patient is nervous/anxious.     Blood pressure 120/86, pulse 84, temperature 98 F (36.7 C), temperature source Temporal, resp. rate 14, height 5\' 3"  (1.6 Barnes), weight 143 lb 3.2 oz (64.955 kg).  Physical Exam Physical Exam  Constitutional: She is oriented to person, place, and time. She appears well-developed and well-nourished. No distress.  HENT:  Head: Normocephalic and atraumatic.  Nose: Nose normal.  Mouth/Throat: No oropharyngeal exudate.  Eyes: Conjunctivae and EOM are normal. Pupils are equal, round, and reactive to light. Left eye exhibits no discharge. No scleral icterus.  Neck: Neck supple. No JVD present. No tracheal deviation present. No thyromegaly present.  Cardiovascular: Normal rate, regular rhythm, normal heart sounds and intact distal  pulses.   No murmur heard. Pulmonary/Chest: Effort normal and breath sounds normal. No respiratory distress. She has no wheezes. She has no rales. She exhibits no tenderness.  Abdominal: Soft. Bowel sounds are normal. She exhibits no distension and no mass. There is no tenderness. There is no rebound and no guarding.  Healed Pfannenstiel incision. No mass or tenderness  in the lower abdomen. No organomegaly.  Musculoskeletal: She exhibits no edema and no tenderness.  Lymphadenopathy:    She has no cervical adenopathy.  Neurological: She is alert and oriented to person, place, and time. She exhibits normal muscle tone. Coordination normal.  Skin: Skin is warm. No rash noted. She is not diaphoretic. No erythema. No pallor.  Psychiatric: She has a normal mood and affect. Her behavior is normal. Judgment and thought content normal.    Data Reviewed Dr. Lear Ng notes. [facility] office notes and colonoscopy report. Pathology report. 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 Clinical stage TII, N0     Plan    I advised the patient and her husband that I agree with Dr. Lear Ng diagnosis and treatment plan. I told him that I would essentially do the same plan, laparoscopic-assisted sigmoid colectomy with a preop bowel prep.  I discussed the indications, details, techniques, numerous risk of the surgery with him. They understand all these issues all her questions are answered.  We discussed whether or not she would need adjuvant therapy and I told them that decision would happen later after the final pathology report was completed.  At the end of the conversation and encounter, they were very comfortable and plan to proceed with the surgery as scheduled.        Penny Barnes 08/13/2013, 10:30 AM

## 2013-08-13 NOTE — Patient Instructions (Signed)
You have been diagnosed with a non-circumferential adenocarcinoma of the sigmoid colon at 20-25 cm. Your CT scan showed no evidence of advanced disease and no evidence of cancer spread.  I completely agree with Dr. Lear Ng recommendations for elective laparoscopic-assisted sigmoid colectomy. I agree with the recommendations for bowel prep the day before.  After you recover from the surgery, you will need to be referred to a medical oncologist. Whether or not you receive further treatment such as chemotherapy will depend on the pathology report and stage.

## 2013-08-18 ENCOUNTER — Encounter (HOSPITAL_COMMUNITY): Payer: Self-pay | Admitting: Pharmacy Technician

## 2013-08-19 NOTE — Patient Instructions (Addendum)
Penny Barnes  08/19/2013                           YOUR PROCEDURE IS SCHEDULED ON: 08/25/13               PLEASE REPORT TO SHORT STAY CENTER AT : 8:45 AM               CALL THIS NUMBER IF ANY PROBLEMS THE DAY OF SURGERY :               [phone]                     REMEMBER:   Do not eat food or drink liquids AFTER MIDNIGHT     Take these medicines the morning of surgery with A SIP OF WATER: none   Do not wear jewelry, make-up   Do not wear lotions, powders, or perfumes.   Do not shave legs or underarms 12 hrs. before surgery (men may shave face)  Do not bring valuables to the hospital.  Contacts, dentures or bridgework may not be worn into surgery.  Leave suitcase in the car. After surgery it may be brought to your room.  For patients admitted to the hospital more than one night, checkout time is 11:00 AM                       The day of discharge.   Patients discharged the day of surgery will not be allowed to drive home.              If going home same day of surgery, must have someone stay with you first              24 hrs at home and arrange for some one to drive you home from hospital.    Special Instructions:   Please read over the following fact sheets that you were given:               1. [place]               2. [place]                                                X_____________________________________________________________________        Failure to follow these instructions may result in cancellation of your surgery

## 2013-08-20 ENCOUNTER — Encounter (HOSPITAL_COMMUNITY)
Admission: RE | Admit: 2013-08-20 | Discharge: 2013-08-20 | Disposition: A | Discharge status: Home / Self Care | Payer: 59 | Source: Ambulatory Visit | Attending: General Surgery | Admitting: General Surgery

## 2013-08-20 ENCOUNTER — Encounter (HOSPITAL_COMMUNITY): Payer: Self-pay

## 2013-08-20 DIAGNOSIS — Z01812 Encounter for preprocedural laboratory examination: Secondary | ICD-10-CM | POA: Insufficient documentation

## 2013-08-20 HISTORY — DX: Malignant neoplasm of colon, unspecified: C18.9

## 2013-08-20 HISTORY — DX: Vitiligo: L80

## 2013-08-20 LAB — CBC
HCT: 42.8 % (ref 36.0–46.0)
Hemoglobin: 14.6 g/dL (ref 12.0–15.0)
MCH: 30.2 pg (ref 26.0–34.0)
MCHC: 34.1 g/dL (ref 30.0–36.0)
MCV: 88.4 fL (ref 78.0–100.0)
Platelets: 218 10*3/uL (ref 150–400)
RBC: 4.84 MIL/uL (ref 3.87–5.11)
RDW: 12.5 % (ref 11.5–15.5)
WBC: 3.9 10*3/uL — AB (ref 4.0–10.5)

## 2013-08-20 LAB — CEA: CEA: 0.7 ng/mL (ref 0.0–5.0)

## 2013-08-20 LAB — BASIC METABOLIC PANEL
BUN: 15 mg/dL (ref 6–23)
CHLORIDE: 102 meq/L (ref 96–112)
CO2: 29 mEq/L (ref 19–32)
Calcium: 9.8 mg/dL (ref 8.4–10.5)
Creatinine, Ser: 0.67 mg/dL (ref 0.50–1.10)
Glucose, Bld: 95 mg/dL (ref 70–99)
POTASSIUM: 4.2 meq/L (ref 3.7–5.3)
Sodium: 141 mEq/L (ref 137–147)

## 2013-08-24 MED ORDER — CEFOTETAN DISODIUM 2 G IJ SOLR
2.0000 g | INTRAMUSCULAR | Status: AC
  Administered 2013-08-25: 2 g via INTRAVENOUS
  Filled 2013-08-24: qty 2

## 2013-08-25 ENCOUNTER — Encounter (HOSPITAL_COMMUNITY): Payer: Self-pay | Admitting: *Deleted

## 2013-08-25 ENCOUNTER — Inpatient Hospital Stay (HOSPITAL_COMMUNITY)
Admission: RE | Admit: 2013-08-25 | Discharge: 2013-08-28 | DRG: 330 | Disposition: A | Discharge status: Home / Self Care | Payer: 59 | Source: Ambulatory Visit | Attending: General Surgery | Admitting: General Surgery

## 2013-08-25 ENCOUNTER — Encounter (HOSPITAL_COMMUNITY)
Admission: RE | Disposition: A | Discharge status: Home / Self Care | Payer: Self-pay | Source: Ambulatory Visit | Attending: General Surgery

## 2013-08-25 ENCOUNTER — Inpatient Hospital Stay (HOSPITAL_COMMUNITY): Payer: 59 | Admitting: Certified Registered Nurse Anesthetist

## 2013-08-25 ENCOUNTER — Encounter (HOSPITAL_COMMUNITY): Payer: 59 | Admitting: Certified Registered Nurse Anesthetist

## 2013-08-25 DIAGNOSIS — C187 Malignant neoplasm of sigmoid colon: Principal | ICD-10-CM | POA: Diagnosis present

## 2013-08-25 DIAGNOSIS — Z833 Family history of diabetes mellitus: Secondary | ICD-10-CM

## 2013-08-25 DIAGNOSIS — C772 Secondary and unspecified malignant neoplasm of intra-abdominal lymph nodes: Secondary | ICD-10-CM | POA: Diagnosis present

## 2013-08-25 DIAGNOSIS — Z8249 Family history of ischemic heart disease and other diseases of the circulatory system: Secondary | ICD-10-CM

## 2013-08-25 DIAGNOSIS — Z01812 Encounter for preprocedural laboratory examination: Secondary | ICD-10-CM

## 2013-08-25 DIAGNOSIS — C19 Malignant neoplasm of rectosigmoid junction: Secondary | ICD-10-CM

## 2013-08-25 HISTORY — PX: LAPAROSCOPIC SIGMOID COLECTOMY: SHX5928

## 2013-08-25 LAB — TYPE AND SCREEN
ABO/RH(D): B POS
Antibody Screen: NEGATIVE

## 2013-08-25 LAB — ABO/RH: ABO/RH(D): B POS

## 2013-08-25 SURGERY — COLECTOMY, SIGMOID, LAPAROSCOPIC
Anesthesia: General | Site: Abdomen

## 2013-08-25 MED ORDER — PROMETHAZINE HCL 25 MG/ML IJ SOLN
6.2500 mg | INTRAMUSCULAR | Status: AC | PRN
  Administered 2013-08-25 (×2): 6.25 mg via INTRAVENOUS

## 2013-08-25 MED ORDER — KETAMINE HCL 10 MG/ML IJ SOLN
INTRAMUSCULAR | Status: DC | PRN
  Administered 2013-08-25: 30 mg via INTRAVENOUS

## 2013-08-25 MED ORDER — FENTANYL CITRATE 0.05 MG/ML IJ SOLN
INTRAMUSCULAR | Status: AC
  Filled 2013-08-25: qty 5

## 2013-08-25 MED ORDER — BUPIVACAINE-EPINEPHRINE PF 0.25-1:200000 % IJ SOLN
INTRAMUSCULAR | Status: AC
  Filled 2013-08-25: qty 30

## 2013-08-25 MED ORDER — LACTATED RINGERS IR SOLN
Status: DC | PRN
  Administered 2013-08-25: 1

## 2013-08-25 MED ORDER — EPHEDRINE SULFATE 50 MG/ML IJ SOLN
INTRAMUSCULAR | Status: DC | PRN
  Administered 2013-08-25: 10 mg via INTRAVENOUS

## 2013-08-25 MED ORDER — KCL IN DEXTROSE-NACL 20-5-0.9 MEQ/L-%-% IV SOLN
INTRAVENOUS | Status: AC
  Filled 2013-08-25: qty 1000

## 2013-08-25 MED ORDER — ONDANSETRON HCL 4 MG PO TABS: 4.0000 mg | ORAL_TABLET | Freq: Four times a day (QID) | ORAL | Status: DC | PRN

## 2013-08-25 MED ORDER — HYDROMORPHONE HCL PF 1 MG/ML IJ SOLN
INTRAMUSCULAR | Status: AC
  Filled 2013-08-25: qty 1

## 2013-08-25 MED ORDER — MIDAZOLAM HCL 5 MG/5ML IJ SOLN
INTRAMUSCULAR | Status: DC | PRN
  Administered 2013-08-25: 2 mg via INTRAVENOUS

## 2013-08-25 MED ORDER — ALVIMOPAN 12 MG PO CAPS
12.0000 mg | ORAL_CAPSULE | Freq: Once | ORAL | Status: AC
  Administered 2013-08-25: 12 mg via ORAL
  Filled 2013-08-25: qty 1

## 2013-08-25 MED ORDER — GLYCOPYRROLATE 0.2 MG/ML IJ SOLN
INTRAMUSCULAR | Status: DC | PRN
  Administered 2013-08-25: 0.6 mg via INTRAVENOUS

## 2013-08-25 MED ORDER — DEXAMETHASONE SODIUM PHOSPHATE 10 MG/ML IJ SOLN
INTRAMUSCULAR | Status: AC
  Filled 2013-08-25: qty 1

## 2013-08-25 MED ORDER — ROCURONIUM BROMIDE 100 MG/10ML IV SOLN
INTRAVENOUS | Status: AC
  Filled 2013-08-25: qty 1

## 2013-08-25 MED ORDER — LIDOCAINE HCL (CARDIAC) 20 MG/ML IV SOLN
INTRAVENOUS | Status: AC
  Filled 2013-08-25: qty 5

## 2013-08-25 MED ORDER — HEPARIN SODIUM (PORCINE) 5000 UNIT/ML IJ SOLN
5000.0000 [IU] | Freq: Three times a day (TID) | INTRAMUSCULAR | Status: DC
  Administered 2013-08-26 – 2013-08-28 (×7): 5000 [IU] via SUBCUTANEOUS
  Filled 2013-08-25 (×10): qty 1

## 2013-08-25 MED ORDER — OXYCODONE-ACETAMINOPHEN 5-325 MG PO TABS
1.0000 | ORAL_TABLET | ORAL | Status: DC | PRN
  Administered 2013-08-27 (×2): 1 via ORAL
  Filled 2013-08-25: qty 2
  Filled 2013-08-25 (×2): qty 1

## 2013-08-25 MED ORDER — KCL IN DEXTROSE-NACL 20-5-0.9 MEQ/L-%-% IV SOLN
INTRAVENOUS | Status: DC
Start: 2013-08-25 — End: 2013-08-27
  Administered 2013-08-25 – 2013-08-26 (×3): via INTRAVENOUS
  Administered 2013-08-27: 75 mL via INTRAVENOUS
  Filled 2013-08-25 (×5): qty 1000

## 2013-08-25 MED ORDER — METOCLOPRAMIDE HCL 5 MG/ML IJ SOLN
INTRAMUSCULAR | Status: DC | PRN
  Administered 2013-08-25: 10 mg via INTRAVENOUS

## 2013-08-25 MED ORDER — METOCLOPRAMIDE HCL 5 MG/ML IJ SOLN
INTRAMUSCULAR | Status: AC
  Filled 2013-08-25: qty 2

## 2013-08-25 MED ORDER — ONDANSETRON HCL 4 MG/2ML IJ SOLN: 4.0000 mg | Freq: Four times a day (QID) | INTRAMUSCULAR | Status: DC | PRN

## 2013-08-25 MED ORDER — SUCCINYLCHOLINE CHLORIDE 20 MG/ML IJ SOLN
INTRAMUSCULAR | Status: AC
  Filled 2013-08-25: qty 1

## 2013-08-25 MED ORDER — NEOSTIGMINE METHYLSULFATE 1 MG/ML IJ SOLN
INTRAMUSCULAR | Status: DC | PRN
  Administered 2013-08-25: 5 mg via INTRAVENOUS

## 2013-08-25 MED ORDER — PROPOFOL 10 MG/ML IV BOLUS
INTRAVENOUS | Status: DC | PRN
  Administered 2013-08-25: 150 mg via INTRAVENOUS

## 2013-08-25 MED ORDER — ONDANSETRON HCL 4 MG/2ML IJ SOLN
INTRAMUSCULAR | Status: DC | PRN
Start: 2013-08-25 — End: 2013-08-25
  Administered 2013-08-25: 4 mg via INTRAVENOUS

## 2013-08-25 MED ORDER — HETASTARCH-ELECTROLYTES 6 % IV SOLN
INTRAVENOUS | Status: DC | PRN
  Administered 2013-08-25: 12:00:00 via INTRAVENOUS

## 2013-08-25 MED ORDER — HEPARIN SODIUM (PORCINE) 5000 UNIT/ML IJ SOLN
5000.0000 [IU] | Freq: Once | INTRAMUSCULAR | Status: AC
  Administered 2013-08-25: 5000 [IU] via SUBCUTANEOUS
  Filled 2013-08-25: qty 1

## 2013-08-25 MED ORDER — MORPHINE SULFATE 2 MG/ML IJ SOLN
2.0000 mg | INTRAMUSCULAR | Status: DC | PRN
  Administered 2013-08-25 (×2): 2 mg via INTRAVENOUS
  Filled 2013-08-25 (×2): qty 1

## 2013-08-25 MED ORDER — SCOPOLAMINE 1 MG/3DAYS TD PT72
1.0000 | MEDICATED_PATCH | TRANSDERMAL | Status: DC
Start: 2013-08-25 — End: 2013-08-25
  Administered 2013-08-25: 1.5 mg via TRANSDERMAL
  Filled 2013-08-25: qty 1

## 2013-08-25 MED ORDER — ACETAMINOPHEN 10 MG/ML IV SOLN
1000.0000 mg | Freq: Four times a day (QID) | INTRAVENOUS | Status: AC
  Administered 2013-08-25 – 2013-08-26 (×4): 1000 mg via INTRAVENOUS
  Filled 2013-08-25 (×4): qty 100

## 2013-08-25 MED ORDER — PHENYLEPHRINE HCL 10 MG/ML IJ SOLN
INTRAMUSCULAR | Status: DC | PRN
  Administered 2013-08-25 (×4): 80 ug via INTRAVENOUS

## 2013-08-25 MED ORDER — HYDROMORPHONE HCL PF 1 MG/ML IJ SOLN
0.2500 mg | INTRAMUSCULAR | Status: DC | PRN
  Administered 2013-08-25 (×4): 0.5 mg via INTRAVENOUS

## 2013-08-25 MED ORDER — PROMETHAZINE HCL 25 MG/ML IJ SOLN
INTRAMUSCULAR | Status: AC
  Filled 2013-08-25: qty 1

## 2013-08-25 MED ORDER — NEOSTIGMINE METHYLSULFATE 1 MG/ML IJ SOLN
INTRAMUSCULAR | Status: AC
  Filled 2013-08-25: qty 10

## 2013-08-25 MED ORDER — LACTATED RINGERS IV SOLN
INTRAVENOUS | Status: DC
  Administered 2013-08-25: 1000 mL via INTRAVENOUS
  Administered 2013-08-25 (×2): via INTRAVENOUS

## 2013-08-25 MED ORDER — ROCURONIUM BROMIDE 100 MG/10ML IV SOLN
INTRAVENOUS | Status: DC | PRN
  Administered 2013-08-25 (×2): 20 mg via INTRAVENOUS
  Administered 2013-08-25: 50 mg via INTRAVENOUS

## 2013-08-25 MED ORDER — PROPOFOL 10 MG/ML IV BOLUS
INTRAVENOUS | Status: AC
  Filled 2013-08-25: qty 20

## 2013-08-25 MED ORDER — GLYCOPYRROLATE 0.2 MG/ML IJ SOLN
INTRAMUSCULAR | Status: AC
  Filled 2013-08-25: qty 3

## 2013-08-25 MED ORDER — SCOPOLAMINE 1 MG/3DAYS TD PT72
MEDICATED_PATCH | TRANSDERMAL | Status: AC
  Filled 2013-08-25: qty 1

## 2013-08-25 MED ORDER — 0.9 % SODIUM CHLORIDE (POUR BTL) OPTIME
TOPICAL | Status: DC | PRN
  Administered 2013-08-25: 2000 mL

## 2013-08-25 MED ORDER — LIDOCAINE HCL (CARDIAC) 20 MG/ML IV SOLN
INTRAVENOUS | Status: DC | PRN
  Administered 2013-08-25: 100 mg via INTRAVENOUS

## 2013-08-25 MED ORDER — FENTANYL CITRATE 0.05 MG/ML IJ SOLN
INTRAMUSCULAR | Status: DC | PRN
  Administered 2013-08-25: 50 ug via INTRAVENOUS
  Administered 2013-08-25 (×2): 100 ug via INTRAVENOUS
  Administered 2013-08-25 (×4): 50 ug via INTRAVENOUS

## 2013-08-25 MED ORDER — DEXAMETHASONE SODIUM PHOSPHATE 10 MG/ML IJ SOLN
INTRAMUSCULAR | Status: DC | PRN
  Administered 2013-08-25: 10 mg via INTRAVENOUS

## 2013-08-25 MED ORDER — ALVIMOPAN 12 MG PO CAPS
12.0000 mg | ORAL_CAPSULE | Freq: Two times a day (BID) | ORAL | Status: DC
  Administered 2013-08-26 – 2013-08-27 (×4): 12 mg via ORAL
  Filled 2013-08-25 (×6): qty 1

## 2013-08-25 MED ORDER — MIDAZOLAM HCL 2 MG/2ML IJ SOLN
INTRAMUSCULAR | Status: AC
  Filled 2013-08-25: qty 2

## 2013-08-25 MED ORDER — BUPIVACAINE-EPINEPHRINE 0.25% -1:200000 IJ SOLN
INTRAMUSCULAR | Status: DC | PRN
  Administered 2013-08-25: 30 mL

## 2013-08-25 MED ORDER — PHENYLEPHRINE 40 MCG/ML (10ML) SYRINGE FOR IV PUSH (FOR BLOOD PRESSURE SUPPORT)
PREFILLED_SYRINGE | INTRAVENOUS | Status: AC
  Filled 2013-08-25: qty 10

## 2013-08-25 MED ORDER — SUCCINYLCHOLINE CHLORIDE 20 MG/ML IJ SOLN
INTRAMUSCULAR | Status: DC | PRN
  Administered 2013-08-25: 100 mg via INTRAVENOUS

## 2013-08-25 MED ORDER — ACETAMINOPHEN 10 MG/ML IV SOLN
1000.0000 mg | INTRAVENOUS | Status: AC
  Administered 2013-08-25: 1000 mg via INTRAVENOUS
  Filled 2013-08-25: qty 100

## 2013-08-25 MED ORDER — ONDANSETRON HCL 4 MG/2ML IJ SOLN
INTRAMUSCULAR | Status: AC
  Filled 2013-08-25: qty 2

## 2013-08-25 SURGICAL SUPPLY — 74 items
APPLIER CLIP 5 13 M/L LIGAMAX5 (MISCELLANEOUS)
APPLIER CLIP ROT 10 11.4 M/L (STAPLE) ×2
BLADE HEX COATED 2.75 (ELECTRODE) IMPLANT
BLADE SURG SZ10 CARB STEEL (BLADE) ×2 IMPLANT
CELLS DAT CNTRL 66122 CELL SVR (MISCELLANEOUS) ×1 IMPLANT
CHLORAPREP W/TINT 26ML (MISCELLANEOUS) ×2 IMPLANT
CLIP APPLIE 5 13 M/L LIGAMAX5 (MISCELLANEOUS) IMPLANT
CLIP APPLIE ROT 10 11.4 M/L (STAPLE) ×1 IMPLANT
COVER MAYO STAND STRL (DRAPES) ×4 IMPLANT
COVER SURGICAL LIGHT HANDLE (MISCELLANEOUS) IMPLANT
CUTTER FLEX LINEAR 45M (STAPLE) ×2 IMPLANT
DERMABOND ADVANCED (GAUZE/BANDAGES/DRESSINGS) ×1
DERMABOND ADVANCED .7 DNX12 (GAUZE/BANDAGES/DRESSINGS) ×1 IMPLANT
DRAPE CAMERA CLOSED 9X96 (DRAPES) ×2 IMPLANT
DRAPE LAPAROSCOPIC ABDOMINAL (DRAPES) ×2 IMPLANT
DRAPE LG THREE QUARTER DISP (DRAPES) ×2 IMPLANT
DRAPE WARM FLUID 44X44 (DRAPE) ×2 IMPLANT
ELECT REM PT RETURN 9FT ADLT (ELECTROSURGICAL) ×2
ELECTRODE REM PT RTRN 9FT ADLT (ELECTROSURGICAL) ×1 IMPLANT
ENDOLOOP SUT PDS II  0 18 (SUTURE)
ENDOLOOP SUT PDS II 0 18 (SUTURE) IMPLANT
GOWN STRL REUS W/TWL LRG LVL3 (GOWN DISPOSABLE) ×2 IMPLANT
GOWN STRL REUS W/TWL XL LVL3 (GOWN DISPOSABLE) ×16 IMPLANT
KIT BASIN OR (CUSTOM PROCEDURE TRAY) ×2 IMPLANT
LEGGING LITHOTOMY PAIR STRL (DRAPES) ×2 IMPLANT
LIGASURE IMPACT 36 18CM CVD LR (INSTRUMENTS) IMPLANT
MARKER SKIN DUAL TIP RULER LAB (MISCELLANEOUS) IMPLANT
NS IRRIG 1000ML POUR BTL (IV SOLUTION) ×4 IMPLANT
PENCIL BUTTON HOLSTER BLD 10FT (ELECTRODE) ×4 IMPLANT
RELOAD 45 VASCULAR/THIN (ENDOMECHANICALS) ×2 IMPLANT
RELOAD STAPLE TA45 3.5 REG BLU (ENDOMECHANICALS) ×6 IMPLANT
RTRCTR WOUND ALEXIS 18CM MED (MISCELLANEOUS) ×2
SCALPEL HARMONIC ACE (MISCELLANEOUS) ×2 IMPLANT
SCISSORS LAP 5X35 DISP (ENDOMECHANICALS) ×2 IMPLANT
SEALER TISSUE G2 CVD JAW 35 (ENDOMECHANICALS) IMPLANT
SEALER TISSUE G2 CVD JAW 45CM (ENDOMECHANICALS)
SET IRRIG TUBING LAPAROSCOPIC (IRRIGATION / IRRIGATOR) ×2 IMPLANT
SLEEVE ADV FIXATION 12X100MM (TROCAR) IMPLANT
SLEEVE ADV FIXATION 5X100MM (TROCAR) IMPLANT
SOLUTION ANTI FOG 6CC (MISCELLANEOUS) ×2 IMPLANT
SPONGE GAUZE 4X4 12PLY (GAUZE/BANDAGES/DRESSINGS) IMPLANT
SPONGE LAP 18X18 X RAY DECT (DISPOSABLE) ×4 IMPLANT
STAPLER CIRC ILS CVD 25MM (STAPLE) ×2 IMPLANT
STAPLER VISISTAT 35W (STAPLE) ×2 IMPLANT
SUCTION POOLE TIP (SUCTIONS) ×2 IMPLANT
SUT MNCRL AB 4-0 PS2 18 (SUTURE) ×4 IMPLANT
SUT PDS AB 0 CT 36 (SUTURE) ×4 IMPLANT
SUT PDS AB 1 CT1 27 (SUTURE) IMPLANT
SUT PDS AB 1 CTX 36 (SUTURE) IMPLANT
SUT PDS AB 1 TP1 96 (SUTURE) ×4 IMPLANT
SUT PDS AB 2-0 CT2 27 (SUTURE) ×2 IMPLANT
SUT PROLENE 2 0 KS (SUTURE) ×2 IMPLANT
SUT SILK 2 0 (SUTURE) ×1
SUT SILK 2 0 SH CR/8 (SUTURE) ×2 IMPLANT
SUT SILK 2-0 18XBRD TIE 12 (SUTURE) ×1 IMPLANT
SUT SILK 3 0 (SUTURE) ×1
SUT SILK 3 0 SH CR/8 (SUTURE) ×2 IMPLANT
SUT SILK 3-0 18XBRD TIE 12 (SUTURE) ×1 IMPLANT
SYR BULB IRRIGATION 50ML (SYRINGE) ×2 IMPLANT
SYS LAPSCP GELPORT 120MM (MISCELLANEOUS)
SYSTEM LAPSCP GELPORT 120MM (MISCELLANEOUS) IMPLANT
TOWEL OR 17X26 10 PK STRL BLUE (TOWEL DISPOSABLE) ×4 IMPLANT
TOWEL OR NON WOVEN STRL DISP B (DISPOSABLE) ×2 IMPLANT
TRAY FOLEY CATH 14FRSI W/METER (CATHETERS) ×2 IMPLANT
TRAY LAP CHOLE (CUSTOM PROCEDURE TRAY) ×2 IMPLANT
TROCAR ADV FIXATION 11X100MM (TROCAR) IMPLANT
TROCAR ADV FIXATION 12X100MM (TROCAR) IMPLANT
TROCAR ADV FIXATION 5X100MM (TROCAR) IMPLANT
TROCAR BLADELESS OPT 5 75 (ENDOMECHANICALS) ×4 IMPLANT
TROCAR XCEL BLUNT TIP 100MML (ENDOMECHANICALS) ×2 IMPLANT
TROCAR XCEL NON-BLD 11X100MML (ENDOMECHANICALS) ×4 IMPLANT
TUBING INSUFFLATION 10FT LAP (TUBING) ×2 IMPLANT
YANKAUER SUCT BULB TIP 10FT TU (MISCELLANEOUS) ×4 IMPLANT
YANKAUER SUCT BULB TIP NO VENT (SUCTIONS) ×2 IMPLANT

## 2013-08-25 NOTE — Transfer of Care (Signed)
Immediate Anesthesia Transfer of Care Note  Patient: Penny Barnes  Procedure(s) Performed: Procedure(s): LAPAROSCOPIC SIGMOID COLECTOMY (N/A)  Patient Location: PACU  Anesthesia Type:General  Level of Consciousness: awake, alert , sedated and patient cooperative  Airway & Oxygen Therapy: Patient Spontanous Breathing and Patient connected to face mask oxygen  Post-op Assessment: Report given to PACU RN and Post -op Vital signs reviewed and stable  Post vital signs: Reviewed and stable  Complications: No apparent anesthesia complications

## 2013-08-25 NOTE — H&P (View-Only) (Signed)
Subjective:   new diagnosis cancer of the sigmoid colon  Patient ID: Penny Barnes, female   DOB: 1960/11/23, 53 y.o.   MRN: [id number]  HPI Patient is a very pleasant 53 year old female, followed by Dr. Sarajane Jews and and referred by Dr. Fuller Plan 4 new diagnosis of adenocarcinoma of the colon. Just before the holidays she developed some abdominal cramping and diarrhea and then soon after noted blood in her stools. The cramping and diarrhea stopped after about 24 hours but she continued to have some intermittent blood with her bowel movements. She saw Dr. Sarajane Jews and was referred for colonoscopy. Dr. Fuller Plan performed colonoscopy and I have the report and photographs. This shows an ulcerated 3 cm mass over about one third circumference of the sigmoid colon at 20-25 cm. Nonobstructive. Biopsies were obtained. This has revealed adenocarcinoma. The patient has no previous significant GI history. She subsequently has had CT scans of the chest abdomen and pelvis which shows no findings to suggest metastatic disease and a mass in the sigmoid is not apparent.  History reviewed. No pertinent past medical history. Past Surgical History  Procedure Laterality Date  . Abdominal hysterectomy      have one ovary   Current Outpatient Prescriptions  Medication Sig Dispense Refill  . hyoscyamine (LEVSIN/SL) 0.125 MG SL tablet Place 1 tablet (0.125 mg total) under the tongue every 4 (four) hours as needed.  10 tablet  0   No current facility-administered medications for this visit.   Allergies  Allergen Reactions  . Aspirin Hives     Review of Systems  Constitutional: Negative.   Respiratory: Negative.   Cardiovascular: Negative.   Gastrointestinal: Positive for blood in stool. Negative for nausea, abdominal pain, diarrhea, abdominal distention and rectal pain.  Genitourinary: Negative.   Musculoskeletal: Negative.        Objective:   Physical Exam BP 138/88  Pulse 72  Temp(Src) 98.3 F (36.8 C) (Temporal)   Resp 14  Ht 5\' 3"  (1.6 m)  Wt 142 lb 9.6 oz (64.683 kg)  BMI 25.27 kg/m2 General: Alert, well-developed Caucasian female, in no distress Skin: Warm and dry without rash or infection. HEENT: No palpable masses or thyromegaly. Sclera nonicteric. Pupils equal round and reactive. Oropharynx clear. Lymph nodes: No cervical, supraclavicular, or inguinal nodes palpable. Lungs: Breath sounds clear and equal without increased work of breathing Cardiovascular: Regular rate and rhythm without murmur. No JVD or edema. Peripheral pulses intact. Abdomen: Nondistended. Soft and nontender. No masses palpable. No organomegaly. No palpable hernias. Rectal: No mass or tenderness. Normal tone. Soft brown stool without blood noted. Extremities: No edema or joint swelling or deformity. No chronic venous stasis changes. Neurologic: Alert and fully oriented. Gait normal.    Assessment:     New diagnosis adenocarcinoma of the sigmoid colon. This hopefully s an early stage lesion. Had a long discussion with the patient and her husband regarding the diagnosis and its treatment. I have recommended proceeding with laparoscopic sigmoid colectomy. We discussed the nature and indications of the procedure in detail as well as expected recovery. We discussed that additional therapy would depend on final pathology results. They were given literature regarding the procedure. The rest of the procedure including anesthetic complications, bleeding, infection, anastomotic leak and possible need for larger incision or ostomy were discussed and understood and all questions answered.    Plan:     Will proceed with planning for laparoscopic sigmoid colectomy with a.m. Admission. Antibiotic and mechanical bowel prep were given.

## 2013-08-25 NOTE — H&P (View-Only) (Signed)
Patient ID: Penny Barnes, female   DOB: February 10, 1961, 53 y.o.   MRN: [id number]  Chief Complaint  Patient presents with  . New Evaluation    2nd opinion eval colonic mass    HPI Penny Barnes is a 53 y.o. female.  She is referred to me by Dr. Hunt Oris for a second opinion regarding adenocarcinoma of the sigmoid colon. She is tentatively scheduled for surgery in February  Please see Dr. Hunt Oris detailed note from January 22. This is a healthy woman who had some abdominal cramping diarrhea and blood in her stools. This has resolved. Colonoscopy showed an ulcerated 3 cm mass in the sigmoid colon at 20-25 cm. CT scan of the chest abdomen and pelvis were negative. Orally prior medical problems are abdominal hysterectomy and what with one ovary removed through a Pfannenstiel incision. Family history is negative for colorectal neoplasia.  HPI  History reviewed. No pertinent past medical history.  Past Surgical History  Procedure Laterality Date  . Abdominal hysterectomy      have one ovary    Family History  Problem Relation Age of Onset  . Diverticulosis Mother   . Kidney Stones Mother   . Thyroid disease Mother   . Diabetes Father   . Heart disease Father   . Colon cancer Neg Hx   . Rectal cancer Neg Hx   . Stomach cancer Neg Hx   . Cancer Sister     thyroid  . Cancer Maternal Grandmother     breast    Social History History  Substance Use Topics  . Smoking status: Never Smoker   . Smokeless tobacco: Never Used  . Alcohol Use: No     Comment: social    Allergies  Allergen Reactions  . Aspirin Hives    Current Outpatient Prescriptions  Medication Sig Dispense Refill  . hyoscyamine (LEVSIN/SL) 0.125 MG SL tablet Place 1 tablet (0.125 mg total) under the tongue every 4 (four) hours as needed.  10 tablet  0   No current facility-administered medications for this visit.    Review of Systems Review of Systems  Constitutional: Negative for fever, chills and unexpected  weight change.  HENT: Negative for congestion, hearing loss, sore throat, trouble swallowing and voice change.   Eyes: Negative for visual disturbance.  Respiratory: Negative for cough and wheezing.   Cardiovascular: Negative for chest pain, palpitations and leg swelling.  Gastrointestinal: Positive for abdominal pain, diarrhea and blood in stool. Negative for nausea, vomiting, constipation, abdominal distention and anal bleeding.  Genitourinary: Negative for hematuria, vaginal bleeding and difficulty urinating.  Musculoskeletal: Negative for arthralgias.  Skin: Negative for rash and wound.  Neurological: Negative for seizures, syncope and headaches.  Hematological: Negative for adenopathy. Does not bruise/bleed easily.  Psychiatric/Behavioral: Negative for confusion. The patient is nervous/anxious.     Blood pressure 120/86, pulse 84, temperature 98 F (36.7 C), temperature source Temporal, resp. rate 14, height 5\' 3"  (1.6 m), weight 143 lb 3.2 oz (64.955 kg).  Physical Exam Physical Exam  Constitutional: She is oriented to person, place, and time. She appears well-developed and well-nourished. No distress.  HENT:  Head: Normocephalic and atraumatic.  Nose: Nose normal.  Mouth/Throat: No oropharyngeal exudate.  Eyes: Conjunctivae and EOM are normal. Pupils are equal, round, and reactive to light. Left eye exhibits no discharge. No scleral icterus.  Neck: Neck supple. No JVD present. No tracheal deviation present. No thyromegaly present.  Cardiovascular: Normal rate, regular rhythm, normal heart sounds and intact distal  pulses.   No murmur heard. Pulmonary/Chest: Effort normal and breath sounds normal. No respiratory distress. She has no wheezes. She has no rales. She exhibits no tenderness.  Abdominal: Soft. Bowel sounds are normal. She exhibits no distension and no mass. There is no tenderness. There is no rebound and no guarding.  Healed Pfannenstiel incision. No mass or tenderness  in the lower abdomen. No organomegaly.  Musculoskeletal: She exhibits no edema and no tenderness.  Lymphadenopathy:    She has no cervical adenopathy.  Neurological: She is alert and oriented to person, place, and time. She exhibits normal muscle tone. Coordination normal.  Skin: Skin is warm. No rash noted. She is not diaphoretic. No erythema. No pallor.  Psychiatric: She has a normal mood and affect. Her behavior is normal. Judgment and thought content normal.    Data Reviewed Dr. Hoxworth's notes. [facility] office notes and colonoscopy report. Pathology report. 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 Clinical stage TII, N0     Plan    I advised the patient and her husband that I agree with Dr. Hoxworth's diagnosis and treatment plan. I told him that I would essentially do the same plan, laparoscopic-assisted sigmoid colectomy with a preop bowel prep.  I discussed the indications, details, techniques, numerous risk of the surgery with him. They understand all these issues all her questions are answered.  We discussed whether or not she would need adjuvant therapy and I told them that decision would happen later after the final pathology report was completed.  At the end of the conversation and encounter, they were very comfortable and plan to proceed with the surgery as scheduled.        Penny Barnes M 08/13/2013, 10:30 AM    

## 2013-08-25 NOTE — Preoperative (Signed)
Beta Blockers   Reason not to administer Beta Blockers:Not Applicable 

## 2013-08-25 NOTE — Interval H&P Note (Signed)
History and Physical Interval Note:  08/25/2013 10:39 AM  Penny Barnes  has presented today for surgery, with the diagnosis of cancer of sigmoid colon   The various methods of treatment have been discussed with the patient and family. After consideration of risks, benefits and other options for treatment, the patient has consented to  Procedure(s): LAPAROSCOPIC SIGMOID COLECTOMY (N/A) as a surgical intervention .  The patient's history has been reviewed, patient examined, no change in status, stable for surgery.  I have reviewed the patient's chart and labs.  Questions were answered to the patient's satisfaction.     Alisyn Lequire T

## 2013-08-25 NOTE — Anesthesia Postprocedure Evaluation (Signed)
  Anesthesia Post-op Note  Patient: Penny Barnes  Procedure(s) Performed: Procedure(s) (LRB): LAPAROSCOPIC SIGMOID COLECTOMY (N/A)  Patient Location: PACU  Anesthesia Type: General  Level of Consciousness: awake and alert   Airway and Oxygen Therapy: Patient Spontanous Breathing  Post-op Pain: mild  Post-op Assessment: Post-op Vital signs reviewed, Patient's Cardiovascular Status Stable, Respiratory Function Stable, Patent Airway and No signs of Nausea or vomiting  Last Vitals:  Filed Vitals:   08/25/13 1515  BP:   Pulse:   Temp: 36.4 C  Resp:     Post-op Vital Signs: stable   Complications: No apparent anesthesia complications

## 2013-08-25 NOTE — Interval H&P Note (Signed)
History and Physical Interval Note:  08/25/2013 10:37 AM  Penny Barnes  has presented today for surgery, with the diagnosis of cancer of sigmoid colon   The various methods of treatment have been discussed with the patient and family. After consideration of risks, benefits and other options for treatment, the patient has consented to  Procedure(s): LAPAROSCOPIC SIGMOID COLECTOMY (N/A) as a surgical intervention .  The patient's history has been reviewed, patient examined, no change in status, stable for surgery.  I have reviewed the patient's chart and labs.  Questions were answered to the patient's satisfaction.     Shandel Busic T

## 2013-08-25 NOTE — Anesthesia Preprocedure Evaluation (Addendum)
Anesthesia Evaluation    History of Anesthesia Complications (+) PONV and history of anesthetic complications  Airway Mallampati: II TM Distance: >3 FB Neck ROM: Full    Dental no notable dental hx.    Pulmonary  breath sounds clear to auscultation  Pulmonary exam normal       Cardiovascular Rhythm:Regular Rate:Normal     Neuro/Psych    GI/Hepatic   Endo/Other    Renal/GU      Musculoskeletal   Abdominal   Peds  Hematology   Anesthesia Other Findings   Reproductive/Obstetrics                          Anesthesia Physical Anesthesia Plan  ASA: II  Anesthesia Plan: General   Post-op Pain Management:    Induction: Intravenous  Airway Management Planned: Oral ETT  Additional Equipment:   Intra-op Plan:   Post-operative Plan: Extubation in OR  Informed Consent: I have reviewed the patients History and Physical, chart, labs and discussed the procedure including the risks, benefits and alternatives for the proposed anesthesia with the patient or authorized representative who has indicated his/her understanding and acceptance.   Dental advisory given  Plan Discussed with: CRNA  Anesthesia Plan Comments:         Anesthesia Quick Evaluation

## 2013-08-25 NOTE — Op Note (Signed)
Preoperative Diagnosis: cancer of sigmoid colon   Postoprative Diagnosis: cancer of sigmoid colon   Procedure: Procedure(s): LAPAROSCOPIC SIGMOID COLECTOMY   Surgeon: Glenna FellowsHoxworth, Gerritt Galentine T   Assistants: Gaynelle AduEric Wilson  Anesthesia:  General endotracheal anesthesia  Indications: patient is a 53 year old female who recently noted intermittent rectal bleeding. Colonoscopy was performed by Dr. Russella DarStark which has revealed a 2-1/2 cm ulcerated mass at 20 cm from the anal verge and biopsy has revealed moderately differentiated adenocarcinoma. CT scan is unremarkable and CEA is normal. I have recommended proceeding with laparoscopic sigmoid colectomy. The procedure has been discussed in detail with the patient and her family including the indications and risks documented elsewhere. Following a mechanical and antibiotic bowel prep at home she is brought to the operating room for this procedure.  Procedure Detail:  Patient was brought to the operating room, placed in the supine position on the operating table, and general endotracheal anesthesia induced. She received broad-spectrum preoperative IV antibiotics. PAS were in place. She was carefully positioned and padded semi-lithotomy position. Foley catheter was placed.Abdomen and perineum were widely sterilely prepped and draped. Patient timeout was performed and correct procedure verified. Access was obtained with a 1 cm incision just beneath the umbilicus with an open Hassan technique without difficulty with a mattress suture of 0 Vicryl and pneumoperitoneum established. Under direct vision a 12 mm trocar was placed at the lateral edge of her previous Pfannenstiel incision in the right lower quadrant and a 5 mm trocar in the right lateral midabdomen and another 5 mm trocar in the left lower quadrant at the edge of her Pfannenstiel incision. The sigmoid colon was noted to be quite long and redundant. There was clear tattooing proximal and distal as noted in the  colonoscopic report and some mild thickening of the distal sigmoid colon between the tattoos consistent with the tumor. There was no evidence of invasion through the wall or any abnormality in the mesentery or any peritoneal or liver abnormalities noted. The sigmoid colon was elevated and the base of the rectosigmoid mesentery exposed from below. Beginning just above the sacral promontory the peritoneum was incised along the sigmoid colon working up toward the inferior mesenteric pedicle. The avascular retroperitoneal plane was entered without difficulty and the mesentery of the rectosigmoid bluntly dissected up out of the peritoneum. The left ureter was identified early in the dissection and was dissected posteriorly away from the mesentery along its length. Dissection continued out laterally until the mesentery was completely freed to the peritoneal attachments laterally. I extended the dissection superiorly and the inferior mesenteric pedicle was clearly identified and was encircled with the ureter in view and clearly posterior to our dissection. The artery and a portion of the pedicle were divided with a single firing of the GIA 45 mm white load stapler. The vein was clipped proximally and divided with harmonic scalpel. The mesentery was then dissected up out of the retroperitoneum with harmonic scalpel dividing its near its origin and keeping the ureter in view at all times. Following this lateral peritoneal attachments were dissected along the left and sigmoid colon completely mobilizing it over toward the midline and connecting with our retroperitoneal dissection. The peritoneum was incised on either side of the rectosigmoid down toward the peritoneal reflection. A point of division of the upper rectum about 5 cm from the peritoneal reflection and below the lower area of tattooing was chosen. The mesentery was carefully bluntly dissected away from the bowel at this point and a window created posterior  to the  bowel. The rectosigmoid was then divided with 2 firings of the 45 mm blue load endoscopic stapler. The mesentery of the upper rectum and rectosigmoid was then sequentially divided with the Harmonic scalpel working superiorly and again keeping the ureter in view at all times until we connected with our mesenteric dissection more superiorly. At this point the divided possible and was clamped with an endoscopic clamp and we made a approximately 4 cm incision at the left lateral end of her Pfannenstiel incision dissection was carried down to the anterior fascia, the muscle bluntly split and the peritoneum entered. A wound protector was placed. The end of the bowel was brought out and there was plenty of length of sigmoid and left colon to go at least 10 cm proximal to the palpable mass in the bowel. At this point the colon was cleared of mesentery and divided after applying the pursestring clamp and a 2-0 Prolene pursestring suture placed. There was good bleeding at the end of the bowel and the pursestring suture was secured. The diameter of the colon was quite small and would barely admit a 25 mm sizer and we felt this was the maximum size stapler that it would accommodate. A 25 mm EEA was chosen and the anvil placed in the end of the proximal bowel the pursestring suture secured. This was then dropped back into the abdomen and the incision was sealed with the wound protector. The abdomen was reinsufflated and Dr. Redmond Pulling went below and passed initially the 25 mm sizer without difficulty to the distal staple line and then the 25 mm EEA stapler placed at the distal staple line and under vision the spike was deployed just anterior to the middle of the staple line. The anvil was connected to the spike making sure there was no twisting of the proximal colon the stapler was closed. The antral had reached easily down to the distal staple line on its own with no tension prior to connection. The stapler was fired and removed  without difficulty. There were 2 thick complete doughnuts. Following this with the bowel clamped above the anastomosis and under saline irrigation the portals were performed sigmoidoscopy and with the bowel tensely distended with air there was seen to be a very tiny stream of bubbles coming from the anastomosis anteriorly at a single point. There was no obvious defect seen at this point it was well visualized. I then placed a single figure-of-eight suture of 3-0 silk full thickness across the anastomosis at this point. Following this we reinsufflated the bowel with the sigmoidoscope and again with tensely distended under saline irrigation there was no evidence of leak in the anastomosis could be visualized and was widely patent. The bowel was desufflated and the sigmoidoscope removed. The abdomen was thoroughly irrigated there was no evidence of bleeding or bowel injury. At this point all clothes and instruments and drapes were changed. Wounds were infiltrated with Marcaine. The mattress suture the umbilicus was secured and the small incision in the left lower quadrant was closed with 2-0 PDS on the peritoneum and anterior fascia closed with 0 PDS. Skin incisions were closed with subcutaneous 2-0 Monocryl and Dermabond. Sponge needle and instrument counts were correct. Gross examination of the specimen by the pathologist showed widely clear margins and a 2 cm ulcerated tumor.    Findings: As above  Estimated Blood Loss:  Minimal         Drains: none  Blood Given: none  Specimens: sigmoid colon        Complications:  * No complications entered in OR log *         Disposition: PACU - hemodynamically stable.         Condition: stable

## 2013-08-26 ENCOUNTER — Encounter (HOSPITAL_COMMUNITY): Payer: Self-pay | Admitting: General Surgery

## 2013-08-26 LAB — BASIC METABOLIC PANEL
BUN: 8 mg/dL (ref 6–23)
CHLORIDE: 104 meq/L (ref 96–112)
CO2: 25 meq/L (ref 19–32)
Calcium: 8.3 mg/dL — ABNORMAL LOW (ref 8.4–10.5)
Creatinine, Ser: 0.65 mg/dL (ref 0.50–1.10)
GFR calc Af Amer: >90 mL/min (ref >90)
Glucose, Bld: 156 mg/dL — ABNORMAL HIGH (ref 70–99)
POTASSIUM: 4.3 meq/L (ref 3.7–5.3)
SODIUM: 137 meq/L (ref 137–147)

## 2013-08-26 LAB — CBC
HEMATOCRIT: 33.6 % — AB (ref 36.0–46.0)
HEMOGLOBIN: 11.2 g/dL — AB (ref 12.0–15.0)
MCH: 29.6 pg (ref 26.0–34.0)
MCHC: 33.3 g/dL (ref 30.0–36.0)
MCV: 88.7 fL (ref 78.0–100.0)
Platelets: 159 10*3/uL (ref 150–400)
RBC: 3.79 MIL/uL — ABNORMAL LOW (ref 3.87–5.11)
RDW: 12.8 % (ref 11.5–15.5)
WBC: 8.1 10*3/uL (ref 4.0–10.5)

## 2013-08-26 NOTE — Care Management Note (Signed)
08/26/13 [place]  Chart reviewed for utilization of services. No needs identified at this time.

## 2013-08-26 NOTE — Plan of Care (Signed)
Problem: Phase II Progression Outcomes Goal: Pain controlled Outcome: Progressing Pain being controlled with PRN pain medications.  Goal: Progress activity as tolerated unless otherwise ordered Outcome: Progressing Pt dangled on side of bed and stood up with no difficulties.  Goal: Vital signs stable Outcome: Progressing Vital signs are stable.

## 2013-08-26 NOTE — Progress Notes (Signed)
Patient ID: Penny Barnes, female   DOB: 1961/05/18, 53 y.o.   MRN: [id number] 1 Day Post-Op  Subjective: Doing very well. Ambulatory mild pain. No nausea.  Objective: Vital signs in last 24 hours: Temp:  [97.6 F (36.4 C)-100.1 F (37.8 C)] 100.1 F (37.8 C) (02/11 0550) Pulse Rate:  [76-101] 76 (02/11 0550) Resp:  [12-18] 16 (02/11 0550) BP: (118-145)/(52-85) 118/52 mmHg (02/11 0550) SpO2:  [98 %-100 %] 99 % (02/11 0550) Weight:  [142 lb (64.411 kg)] 142 lb (64.411 kg) (02/10 1822) Last BM Date: 08/24/13  Intake/Output from previous day: 02/10 0701 - 02/11 0700 In: 5148.3 [P.O.:120; I.V.:4278.3; IV Piggyback:750] Out: 1300 [Urine:1250; Blood:50] Intake/Output this shift: Total I/O In: -  Out: 550 [Urine:550]  General appearance: alert, cooperative and no distress GI: normal findings: soft, non-tender Incision/Wound: clean and dry without evidence of infection or bleeding  Lab Results:   Recent Labs  08/26/13 0445  WBC 8.1  HGB 11.2*  HCT 33.6*  PLT 159   BMET  Recent Labs  08/26/13 0445  NA 137  K 4.3  CL 104  CO2 25  GLUCOSE 156*  BUN 8  CREATININE 0.65  CALCIUM 8.3*     Studies/Results: No results found.  Anti-infectives: Anti-infectives   Start     Dose/Rate Route Frequency Ordered Stop   08/25/13 0600  cefoTEtan (CEFOTAN) 2 g in dextrose 5 % 50 mL IVPB     2 g 100 mL/hr over 30 Minutes Intravenous On call to O.R. 08/24/13 1932 08/25/13 1155      Assessment/Plan: s/p Procedure(s): LAPAROSCOPIC SIGMOID COLECTOMY Doing very well postoperatively. Start clear liquid diet. Increase activity.    LOS: 1 day    Penny Barnes T 08/26/2013

## 2013-08-27 MED ORDER — KCL IN DEXTROSE-NACL 20-5-0.9 MEQ/L-%-% IV SOLN
INTRAVENOUS | Status: DC
  Administered 2013-08-27: 20 mL/h via INTRAVENOUS
  Filled 2013-08-27: qty 1000

## 2013-08-27 NOTE — Progress Notes (Addendum)
Patient received discharge instructions along with written information about next post operative follow up appointment, when to seek care and problems to look for, newly prescribed medications, the need to continue current medications, who their current provider is, how to access my chart, along with potential complications

## 2013-08-27 NOTE — Progress Notes (Signed)
Patient ID: Penny Barnes, female   DOB: 1960/11/27, 53 y.o.   MRN: [id number] 2 Days Post-Op  Subjective: No C/O x a little back pain.  Tol CL without nausea and has had flatus and two loose BMs  Objective: Vital signs in last 24 hours: Temp:  [98.4 F (36.9 C)-99.7 F (37.6 C)] 99.7 F (37.6 C) (02/12 0555) Pulse Rate:  [68-84] 84 (02/12 0555) Resp:  [16-18] 16 (02/12 0555) BP: (106-140)/(58-71) 140/71 mmHg (02/12 0555) SpO2:  [98 %-100 %] 98 % (02/12 0555) Last BM Date: 08/27/13  Intake/Output from previous day: 02/11 0701 - 02/12 0700 In: 1993.8 [P.O.:120; I.V.:1873.8] Out: 3200 [Urine:3200] Intake/Output this shift:    General appearance: alert, cooperative and no distress GI: normal findings: soft, non-tender Incision/Wound: Clean and dry no erythema  Lab Results:   Recent Labs  08/26/13 0445  WBC 8.1  HGB 11.2*  HCT 33.6*  PLT 159   BMET  Recent Labs  08/26/13 0445  NA 137  K 4.3  CL 104  CO2 25  GLUCOSE 156*  BUN 8  CREATININE 0.65  CALCIUM 8.3*     Studies/Results: No results found.  Anti-infectives: Anti-infectives   Start     Dose/Rate Route Frequency Ordered Stop   08/25/13 0600  cefoTEtan (CEFOTAN) 2 g in dextrose 5 % 50 mL IVPB     2 g 100 mL/hr over 30 Minutes Intravenous On call to O.R. 08/24/13 1932 08/25/13 1155      Assessment/Plan: s/p Procedure(s): LAPAROSCOPIC SIGMOID COLECTOMY Doing very well.  Advance diet, poss home in AM   LOS: 2 days    Day Deery T 08/27/2013

## 2013-08-28 ENCOUNTER — Other Ambulatory Visit (INDEPENDENT_AMBULATORY_CARE_PROVIDER_SITE_OTHER): Payer: Self-pay

## 2013-08-28 DIAGNOSIS — C189 Malignant neoplasm of colon, unspecified: Secondary | ICD-10-CM

## 2013-08-28 MED ORDER — OXYCODONE-ACETAMINOPHEN 5-325 MG PO TABS
1.0000 | ORAL_TABLET | ORAL | Status: DC | PRN
Start: 2013-08-28 — End: 2013-09-07

## 2013-08-28 NOTE — Discharge Instructions (Signed)
[facility] ______[facility] °LAPAROSCOPIC SURGERY: POST OP INSTRUCTIONS °Always review your discharge instruction sheet given to you by the facility where your surgery was performed. °IF YOU HAVE DISABILITY OR FAMILY LEAVE FORMS, YOU MUST BRING THEM TO THE OFFICE FOR PROCESSING.   °DO NOT GIVE THEM TO YOUR DOCTOR. ° °1. A prescription for pain medication may be given to you upon discharge.  Take your pain medication as prescribed, if needed.  If narcotic pain medicine is not needed, then you may take acetaminophen (Tylenol) or ibuprofen (Advil) as needed. °2. Take your usually prescribed medications unless otherwise directed. °3. If you need a refill on your pain medication, please contact your pharmacy.  They will contact our office to request authorization. Prescriptions will not be filled after 5pm or on week-ends. °4. You should follow a light diet the first few days after arrival home, such as soup and crackers, etc.  Be sure to include lots of fluids daily. °5. Most patients will experience some swelling and bruising in the area of the incisions.  Ice packs will help.  Swelling and bruising can take several days to resolve.  °6. It is common to experience some constipation if taking pain medication after surgery.  Increasing fluid intake and taking a stool softener (such as Colace) will usually help or prevent this problem from occurring.  A mild laxative (Milk of Magnesia or Miralax) should be taken according to package instructions if there are no bowel movements after 48 hours. °7. Unless discharge instructions indicate otherwise, you may remove your bandages 24-48 hours after surgery, and you may shower at that time.  You may have steri-strips (small skin tapes) in place directly over the incision.  These strips should be left on the skin for 7-10 days.  If your surgeon used skin glue on the incision, you may shower in 24 hours.  The glue will flake off over the next 2-3 weeks.  Any sutures or  staples will be removed at the office during your follow-up visit. °8. ACTIVITIES:  You may resume regular (light) daily activities beginning the next day--such as daily self-care, walking, climbing stairs--gradually increasing activities as tolerated.  You may have sexual intercourse when it is comfortable.  Refrain from any heavy lifting or straining until approved by your doctor. °a. You may drive when you are no longer taking prescription pain medication, you can comfortably wear a seatbelt, and you can safely maneuver your car and apply brakes. °b. RETURN TO WORK:  __________________________________________________________ °9. You should see your doctor in the office for a follow-up appointment approximately 2-3 weeks after your surgery.  Make sure that you call for this appointment within a day or two after you arrive home to insure a convenient appointment time. °10. OTHER INSTRUCTIONS: __________________________________________________________________________________________________________________________ __________________________________________________________________________________________________________________________ °WHEN TO CALL YOUR DOCTOR: °1. Fever over 101.0 °2. Inability to urinate °3. Continued bleeding from incision. °4. Increased pain, redness, or drainage from the incision. °5. Increasing abdominal pain ° °The clinic staff is available to answer your questions during regular business hours.  Please don’t hesitate to call and ask to speak to one of the nurses for clinical concerns.  If you have a medical emergency, go to the nearest emergency room or call 911.  A surgeon from [facility] is always on call at the hospital. °[address], [address], [place], [place]  [address] ? [address] [address], [place],    [address] °[phone] ? [phone] ? FAX [phone] °Web site:   [email or web address] °

## 2013-08-31 ENCOUNTER — Telehealth (INDEPENDENT_AMBULATORY_CARE_PROVIDER_SITE_OTHER): Payer: Self-pay

## 2013-08-31 NOTE — Telephone Encounter (Signed)
Patient is asking for return to work with conditions of working from home on 09-07-13, she is also going to call to change her appt 09-07-13 with Dr. Benay Spice. Please call when her return to work letter is ready

## 2013-09-02 ENCOUNTER — Telehealth: Payer: Self-pay | Admitting: *Deleted

## 2013-09-02 ENCOUNTER — Encounter (INDEPENDENT_AMBULATORY_CARE_PROVIDER_SITE_OTHER): Payer: Self-pay

## 2013-09-02 NOTE — Telephone Encounter (Signed)
Called and spoke to patient regarding her RTW note.  Patient aware that it's available for pick up at the front desk.  Patient states her husband will come by to pick up.  Medical records will fax a copy to patient's employer per her request.

## 2013-09-02 NOTE — Telephone Encounter (Signed)
Spoke with patient and explained role of nurse navigator.  Confirmed appointment with Dr. Benay Spice for 09/07/13.  Contact names, phone numbers, and directions were provided.  Question were answered.  Patient expressed appreciation for the call.

## 2013-09-07 ENCOUNTER — Ambulatory Visit (HOSPITAL_BASED_OUTPATIENT_CLINIC_OR_DEPARTMENT_OTHER): Payer: 59 | Admitting: Oncology

## 2013-09-07 ENCOUNTER — Encounter: Payer: Self-pay | Admitting: Oncology

## 2013-09-07 ENCOUNTER — Ambulatory Visit: Payer: 59

## 2013-09-07 VITALS — BP 147/74 | HR 103 | Temp 98.2°F | Resp 20 | Ht 63.0 in | Wt 137.4 lb

## 2013-09-07 DIAGNOSIS — C772 Secondary and unspecified malignant neoplasm of intra-abdominal lymph nodes: Secondary | ICD-10-CM

## 2013-09-07 DIAGNOSIS — C187 Malignant neoplasm of sigmoid colon: Secondary | ICD-10-CM

## 2013-09-07 NOTE — Progress Notes (Signed)
Met with Penny Barnes and family. Explained role of nurse navigator. Educational information provided on colorectal cancer  Referral made to dietician for diet education. [place] resources provided to patient, including SW service  And support group information.  Information provided on porta cath.  Contact names and phone numbers were provided for entire [facility] team.  Teach back method was used.  No barriers to care identified.  Will continue to follow as needed.

## 2013-09-07 NOTE — Progress Notes (Addendum)
[facility] New Patient Consult   Referring MD: [facility]   Penny Barnes 53 y.o.  1961/05/11    Reason for Referral: Colon cancer     HPI: She had an episode of cramping abdominal pain in late December of 2014. This was followed by blood in the stool. She was referred to Dr. Fuller Plan and was taken to a colonoscopy procedure on 07/24/2013. A mass was noted in the sigmoid colon at 20-25 cm. Multiple biopsies were obtained in the area was tattooed. A 5 mm sessile polyp was removed from the rectum.  The biopsy of the sigmoid colon mass revealed adenocarcinoma. The rectal polyp returned as a hyperplastic polyp.  She was referred for CTs of the chest, abdomen, and pelvis on 07/27/2013. No suspicious pulmonary nodules or masses. No pleural effusion. The liver appeared unremarkable. The sigmoid colon mass was not apparent. No lymphadenopathy. No ascites. Status post hysterectomy.  She was taken the operating room by Dr. Excell Seltzer: 08/25/2013 for a laparoscopic sigmoid colectomy. The tumor was noted in the distal sigmoid colon. No evidence of disease in the mesentery. No peritoneal or liver abnormality.  The pathology ([id number]) confirmed a moderately differentiated adenocarcinoma focally extending into pericolonic connective tissue. Lymphovascular invasion was noted. 2 of 7 lymph nodes contained metastatic carcinoma. The proximal, distal, and mesenteric margins returned negative. No macroscopic tumor perforation. No perineural invasion. No additional polyps. There was normal mismatch repair protein expression.  She reports an uneventful recovery from surgery.  Past Medical History  Diagnosis Date  .  G1 P1    . Vitiligo   . Colon cancer-stage III (T3 N1), sigmoid colon   08/25/2013     Past Surgical History  Procedure Laterality Date  . Abdominal hysterectomy/unilateral oophorectomy         . Laparoscopic sigmoid colectomy N/A 08/25/2013    Procedure: LAPAROSCOPIC  SIGMOID COLECTOMY;  Surgeon: Edward Jolly, MD;  Location: [facility];  Service: General;  Laterality: N/A;    Family History  Problem Relation Age of Onset  . Diverticulosis Mother   . Kidney Stones Mother   . Thyroid disease Mother   . Diabetes Father   . Heart disease Father   . Colon cancer Neg Hx   . Rectal cancer Neg Hx   . Stomach cancer Neg Hx   . Cancer Sister     thyroid  . Cancer Maternal Grandmother     breast   she has 2 sisters. No other family history of cancer.  Current outpatient prescriptions:ibuprofen (ADVIL,MOTRIN) 200 MG tablet, Take 200 mg by mouth every 6 (six) hours as needed., Disp: , Rfl: ;  polyethylene glycol (MIRALAX / GLYCOLAX) packet, Take 17 g by mouth daily., Disp: , Rfl:   Allergies:  Allergies  Allergen Reactions  . Aspirin Hives    Social History: She lives with her husband and [place]. She works in an office occupation. She does not use tobacco. She drinks alcohol occasionally. No transfusion history. No risk factor for HIV or hepatitis.    History  Alcohol Use No    Comment: social    History  Smoking status  . Never Smoker   Smokeless tobacco  . Never Used     ROS:   Positives include: Chronic irregular bowel movements, cramping abdominal pain and rectal bleeding late December of 2014  A complete ROS was otherwise negative.  Physical Exam:  Blood pressure 147/74, pulse 103, temperature 98.2 F (36.8 C), temperature source Oral, resp.  rate 20, height _0  (1.6 m), weight 137 lb 6.4 oz (62.324 kg).  HEENT: Oropharynx without visible mass, neck without mass Lungs: Clear bilaterally Cardiac: Regular rate and rhythm Abdomen: No hepatosplenomegaly, nontender, no mass, healed surgical incisions  Vascular: No leg edema Lymph nodes: No cervical, supraclavicular, axillary, or inguinal nodes Neurologic: Alert and oriented, the motor exam appears intact in the upper and lower extremities Skin: No rash Musculoskeletal: No  spine tenderness   LAB:  CBC  Lab Results  Component Value Date   WBC 8.1 08/26/2013   HGB 11.2* 08/26/2013   HCT 33.6* 08/26/2013   MCV 88.7 08/26/2013   PLT 159 08/26/2013   NEUTROABS 2.5 07/08/2013     CMP      Component Value Date/Time   NA 137 08/26/2013 0445   K 4.3 08/26/2013 0445   CL 104 08/26/2013 0445   CO2 25 08/26/2013 0445   GLUCOSE 156* 08/26/2013 0445   BUN 8 08/26/2013 0445   CREATININE 0.65 08/26/2013 0445   CALCIUM 8.3* 08/26/2013 0445   PROT 7.8 07/08/2013 1005   ALBUMIN 4.3 07/08/2013 1005   AST 14 07/08/2013 1005   ALT 14 07/08/2013 1005   ALKPHOS 62 07/08/2013 1005   BILITOT 0.7 07/08/2013 1005   GFRNONAA >90 08/26/2013 0445   GFRAA >90 08/26/2013 0445   CEA on 08/20/2013-0.7  Radiology: As per history of present illness    Assessment/Plan:   1. 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 Counts has been diagnosed with stage III colon cancer. I discussed the prognosis and adjuvant treatment options with the patient and her family. We reviewed the details of the surgical pathology report. She has a significant chance of developing recurrent colon cancer over the next several years. This is based on the positive lymph nodes, T3 tumor, and lymphovascular invasion.  I reviewed the benefit associated with adjuvant 5-fluorouracil based chemotherapy in patients with resected stage III colon cancer. We also discussed the decrease in the relapse rate seen with the addition of oxaliplatin. We discussed CAPOX and of FOLFOX chemotherapy. We reviewed the CALGB 70263 study. She is not interested in the clinical trial.  I reviewed the potential toxicities associated with the drugs in the CAPOX and FOLFOX regimens. She understands the chance of nausea/vomiting,  mucositis, diarrhea, alopecia, and hematologic toxicity. We discussed the various types of neuropathy associated with oxaliplatin. We reviewed the rash, hyperpigmentation, diarrhea, and hand/foot syndrome seen with IV 5-fluorouracil and capecitabine.  I discussed the treatment schema with both FOLFOX and CAPOX. She is undecided as to which chemotherapy regimen she prefers. I reviewed the equivalent outcome with these regimens.  Ms. Lange will be referred to Dr. Excell Seltzer for placement of a Port-A-Cath. The plan is to begin a first cycle of adjuvant chemotherapy on 09/21/2013. She will attend a chemotherapy teaching class. We will schedule a second cycle of chemotherapy and followup office visit once she decides on the chemotherapy regimen.  She should have a surveillance colonoscopy in one year. Surveillance CT scans will be scheduled yearly for 3 years. I discussed diet and exercise maneuvers that may decrease the chance of developing colon cancer.  Approximately 50 minutes were spent with the patient today. The majority of the time was used for counseling and coordination of care.  [place], [place] 09/07/2013, 4:33  PM

## 2013-09-07 NOTE — Discharge Summary (Signed)
  Patient ID: Penny Barnes [id number] 53 y.o. 06-Mar-1961  08/25/2013  Discharge date and time: 08/27/2013  Admitting Physician: Excell Seltzer T  Discharge Physician: Excell Seltzer T  Admission Diagnoses: cancer of sigmoid colon   Discharge Diagnoses: same  Operations: Procedure(s): LAPAROSCOPIC SIGMOID COLECTOMY  Admission Condition: good  Discharged Condition: good  Indication for Admission: patient is a 53 year old female who recently had an episode of hematochezia and was referred for colonoscopy. This was performed by Dr. Fuller Plan which revealed an ulcerated mass at 20 cm from the anal verge and biopsy has revealed adenocarcinoma. Preoperative CT scan and CEA were unremarkable. The patient is now electively admitted for laparoscopic colectomy.  Hospital Course: patient underwent an uneventful laparoscopic sigmoid colectomy. Her postoperative course was uneventful. She had mild pain on the first postoperative day and no nausea and was advanced to a clear liquid diet.  On the second postoperative day she had had several loose bowel movements and flatus and was advanced to a full liquid diet which she tolerated well. She felt ready for discharge. Abdomen is soft and nontender. Laparoscopic incisions are healing well.    Significant Diagnostic Studies: final pathology revealed a T3 tumor with 2/7 lymph nodes positive. This was discussed with the patient and medical oncology referral will be arranged as an outpatient.    Disposition: Home  Patient Instructions:    Medication List    Notice   You have not been prescribed any medications.      Activity: activity as tolerated Diet: regular diet Wound Care: none needed  Follow-up:  With Dr. Excell Seltzer in 2 weeks.  Signed: Edward Jolly MD, FACS  09/07/2013, 4:51 PM

## 2013-09-07 NOTE — Progress Notes (Signed)
Checked in new patient with no financial issues., she has appt crd,.

## 2013-09-08 ENCOUNTER — Telehealth: Payer: Self-pay | Admitting: *Deleted

## 2013-09-08 ENCOUNTER — Telehealth (INDEPENDENT_AMBULATORY_CARE_PROVIDER_SITE_OTHER): Payer: Self-pay

## 2013-09-08 ENCOUNTER — Telehealth: Payer: Self-pay | Admitting: Oncology

## 2013-09-08 NOTE — Telephone Encounter (Signed)
Penny Barnes with Dr. Malva Cogan states patient need porta cath placement before 09-21-13 Chemo starts on 09-21-13 appt for p/o 09-11-13 note edited for said date

## 2013-09-08 NOTE — Telephone Encounter (Signed)
talked to Penny Barnes gave her appt for chemo class and and nutrition, emailed Sharyn Lull regarding chemo

## 2013-09-08 NOTE — Telephone Encounter (Signed)
Per staff message and POF I have scheduled appts.  JMW  

## 2013-09-09 ENCOUNTER — Other Ambulatory Visit (INDEPENDENT_AMBULATORY_CARE_PROVIDER_SITE_OTHER): Payer: Self-pay | Admitting: General Surgery

## 2013-09-09 NOTE — Addendum Note (Signed)
Addendum created 09/09/13 2107 by Salley Scarlet, MD   Modules edited: Anesthesia Responsible Staff

## 2013-09-10 ENCOUNTER — Ambulatory Visit: Payer: 59

## 2013-09-10 ENCOUNTER — Other Ambulatory Visit: Payer: Self-pay | Admitting: Oncology

## 2013-09-10 ENCOUNTER — Encounter: Payer: 59 | Admitting: Nutrition

## 2013-09-10 ENCOUNTER — Telehealth: Payer: Self-pay | Admitting: Oncology

## 2013-09-10 ENCOUNTER — Other Ambulatory Visit: Payer: Self-pay | Admitting: *Deleted

## 2013-09-10 DIAGNOSIS — C187 Malignant neoplasm of sigmoid colon: Secondary | ICD-10-CM

## 2013-09-10 MED ORDER — PROCHLORPERAZINE MALEATE 10 MG PO TABS: 10.0000 mg | ORAL_TABLET | Freq: Four times a day (QID) | ORAL | Status: DC | PRN

## 2013-09-10 NOTE — Telephone Encounter (Signed)
Talked to pt and she is aware of appt on March 2015, emailed regarding chemo time, chemo drug was changed

## 2013-09-10 NOTE — [facility] Oncology Navigator Note (Signed)
Met with patient and her husband after chemo education class.  The patient has decided on the CapeOx regimen.  Reviewed this regimen with the patient and answered her questions.  Reviewed her current schedule.  She is to come in for her first treatment on 09/21/13.  Dr. Benay Spice made aware of patient's decision.  Per Dr. Benay Spice, patient can start her Xeloda on morning of 09/21/13 or that evening after she has had her Oxaliplatin.  This RN informed patient of above.  This RN asked patient to contact office if she has not heard on where Xeloda will come from by 09/17/13.  Dr. Benay Spice to call in nausea medication to patient's pharmacy and patient is aware.   Dr. Benay Spice said he did not need to see patient prior to treatment unless patient needs to have further questions answered.  Patient stated all of her questions were answered, verbalized comprehension, and stated she would call for any questions.  Will continue to follow.

## 2013-09-11 ENCOUNTER — Other Ambulatory Visit: Payer: Self-pay | Admitting: *Deleted

## 2013-09-11 ENCOUNTER — Telehealth (INDEPENDENT_AMBULATORY_CARE_PROVIDER_SITE_OTHER): Payer: Self-pay

## 2013-09-11 ENCOUNTER — Encounter (HOSPITAL_COMMUNITY): Payer: Self-pay | Admitting: Pharmacy Technician

## 2013-09-11 ENCOUNTER — Ambulatory Visit (INDEPENDENT_AMBULATORY_CARE_PROVIDER_SITE_OTHER): Payer: 59 | Admitting: General Surgery

## 2013-09-11 ENCOUNTER — Encounter (INDEPENDENT_AMBULATORY_CARE_PROVIDER_SITE_OTHER): Payer: Self-pay | Admitting: General Surgery

## 2013-09-11 ENCOUNTER — Encounter: Payer: Self-pay | Admitting: Oncology

## 2013-09-11 VITALS — BP 130/78 | HR 80 | Temp 97.2°F | Resp 16 | Ht 63.0 in | Wt 138.8 lb

## 2013-09-11 DIAGNOSIS — C189 Malignant neoplasm of colon, unspecified: Secondary | ICD-10-CM

## 2013-09-11 MED ORDER — CAPECITABINE 500 MG PO TABS: 1500.0000 mg | ORAL_TABLET | Freq: Two times a day (BID) | ORAL | Status: DC

## 2013-09-11 NOTE — Telephone Encounter (Signed)
Called and spoke to patient to move appointment to earlier time today patient to come in at 3:00pm w/Dr. Excell Seltzer

## 2013-09-11 NOTE — Progress Notes (Signed)
Faxed xeloda prescription to [facility] °

## 2013-09-11 NOTE — Progress Notes (Signed)
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 She is coming along quite well from surgery. Bowel movements are a little more frequent and loose than preop but she denies any abdominal pain or nausea or distention. No fever or chills. She has seen Dr. Benay Spice and chemotherapy is planned. We plan Port-A-Cath placement next week.  Exam: BP 130/78  Pulse 80  Temp(Src) 97.2 F (36.2 C) (Temporal)  Resp 16  Ht 5\' 3"  (1.6 m)  Wt 138 lb 12.8 oz (62.959 kg)  BMI 24.59 kg/m2 General: Appears well Abdomen: Soft and nontender. Incisions all healing well.  Assessment and plan: Doing well following colectomy without complication. The plan Port-A-Cath placement next week. I discussed the nature of the procedure and indications as well as risks of bleeding, infection, pneumothorax, catheter malfunction or displacement and DVT. All her questions were answered.

## 2013-09-11 NOTE — Patient Instructions (Signed)
Implanted Port Insertion An implanted port is a central line that has a round shape and is placed under the skin. It is used as a long-term IV access for:   Medicines, such as chemotherapy.   Fluids.   Liquid nutrition, such as total parenteral nutrition (TPN).   Blood samples.  LET YOUR HEALTH CARE PROVIDER KNOW ABOUT:  Allergies to food or medicine.   Medicines taken, including vitamins, herbs, eye drops, creams, and over-the-counter medicines.   Any allergies to heparin.  Use of steroids (by mouth or creams).   Previous problems with anesthetics or numbing medicines.   History of bleeding problems or blood clots.   Previous surgery.   Other health problems, including diabetes and kidney problems.   Possibility of pregnancy, if this applies. RISKS AND COMPLICATIONS  Damage to the blood vessel, bruising, or bleeding at the puncture site.   Infection.  Blood clot in the vessel that the port is in.  Breakdown of the skin over your port.  Very rarely a person may develop a condition called a pneumothorax, a collection of air in the chest that may cause one of the lungs to collapse. The placement of these catheters with the appropriate imaging guidance significantly decreases the risk of a pneumothorax.  BEFORE THE PROCEDURE   Your health care provider may want you to have blood tests. These tests can help tell how well your kidneys and liver are working. They can also show how well your blood clots.   If you take blood thinners (anticoagulant medicines), ask your health care provider when you should stop taking them.   Make arrangements for someone to drive you home. This is necessary if you have been sedated for your procedure.  PROCEDURE  Port insertion usually takes about 30 45 minutes.   An IV needle will be inserted in your arm. Medicine for pain and medicine to help relax you (sedative) will flow directly into your body through this needle.    You will lie on an exam table, and you will be connected to monitors to keep track of your heart rate, blood pressure, and breathing throughout the procedure.  An oxygen monitoring device may be attached to your finger. Oxygen will be given.   Everything is kept as germ free (sterile) as possible during the procedure. The skin near the point of the incision will be cleaned with antiseptic, and the area will be draped with sterile towels. The skin and deeper tissues over the port area will be made numb with a local anesthetic.  Two small cuts (incisions) will be made in the skin to insert the port. One will be made in the neck to obtain access to the vein where the catheter will lie.   Because the port reservoir is placed under the skin, a small skin incision is made in the upper chest, and a small pocket for the port is made under the skin. The catheter connected to the port tunnels to a large central vein in the chest. A small, raised area remains on your body at the site of the reservoir when the procedure is complete.  The port placement is done under imaging guidance to ensure the proper placement.  The reservoir has a silicone covering that can be punctured with a special needle.   The port is flushed with normal saline, and blood is drawn to make sure it is working properly.  There is nothing remaining outside the skin when the procedure is finished.     Incisions are held together by stitches, surgical glue, or a special tape.  AFTER THE PROCEDURE  You will stay in a recovery area until the anesthesia has worn off. Your blood pressure and pulse will be checked.  A final chest X-ray is taken to check the placement of the port and that there is no injury to your lung.  If there are no problems, you should be able to go home after the procedure.  Document Released: 04/22/2013 Document Reviewed: 03/09/2013 [employer] Patient Information 2014 [employer].  

## 2013-09-15 ENCOUNTER — Encounter (HOSPITAL_COMMUNITY): Payer: Self-pay

## 2013-09-15 ENCOUNTER — Telehealth: Payer: Self-pay | Admitting: *Deleted

## 2013-09-15 NOTE — Telephone Encounter (Signed)
Received notice from [facility] that prescription for Capecitabine has been transferred to [place] due to pt's insurance.

## 2013-09-15 NOTE — Telephone Encounter (Signed)
Received fax from pathology: Colon tissue [id number]: Microsatellite Stable. Results to Dr. Sherrill for review. 

## 2013-09-15 NOTE — Telephone Encounter (Signed)
Call from [facility] pharmacy: Rx has been transferred to [place].

## 2013-09-16 ENCOUNTER — Encounter: Payer: Self-pay | Admitting: Oncology

## 2013-09-16 ENCOUNTER — Encounter (HOSPITAL_COMMUNITY)
Admission: RE | Admit: 2013-09-16 | Discharge: 2013-09-16 | Disposition: A | Discharge status: Home / Self Care | Payer: 59 | Source: Ambulatory Visit | Attending: General Surgery | Admitting: General Surgery

## 2013-09-16 ENCOUNTER — Encounter (HOSPITAL_COMMUNITY): Payer: Self-pay

## 2013-09-16 ENCOUNTER — Telehealth: Payer: Self-pay | Admitting: *Deleted

## 2013-09-16 HISTORY — DX: Other specified postprocedural states: Z98.890

## 2013-09-16 HISTORY — DX: Other specified postprocedural states: R11.2

## 2013-09-16 LAB — CBC
HCT: 39.9 % (ref 36.0–46.0)
HEMOGLOBIN: 13.6 g/dL (ref 12.0–15.0)
MCH: 30.4 pg (ref 26.0–34.0)
MCHC: 34.1 g/dL (ref 30.0–36.0)
MCV: 89.1 fL (ref 78.0–100.0)
Platelets: 219 10*3/uL (ref 150–400)
RBC: 4.48 MIL/uL (ref 3.87–5.11)
RDW: 12.8 % (ref 11.5–15.5)
WBC: 3.5 10*3/uL — ABNORMAL LOW (ref 4.0–10.5)

## 2013-09-16 LAB — BASIC METABOLIC PANEL
BUN: 15 mg/dL (ref 6–23)
CO2: 29 meq/L (ref 19–32)
CREATININE: 0.63 mg/dL (ref 0.50–1.10)
Calcium: 9.8 mg/dL (ref 8.4–10.5)
Chloride: 104 mEq/L (ref 96–112)
GFR calc Af Amer: >90 mL/min (ref >90)
GLUCOSE: 97 mg/dL (ref 70–99)
Potassium: 4.5 mEq/L (ref 3.7–5.3)
SODIUM: 144 meq/L (ref 137–147)

## 2013-09-16 MED ORDER — ERYTHROMYCIN BASE 250 MG PO TABS: 1000.0000 mg | ORAL_TABLET | ORAL | Status: DC

## 2013-09-16 MED ORDER — NEOMYCIN SULFATE 500 MG PO TABS: 1000.0000 mg | ORAL_TABLET | ORAL | Status: DC

## 2013-09-16 MED ORDER — PEG 3350-KCL-NA BICARB-NACL 420 G PO SOLR: 4000.0000 mL | Freq: Once | ORAL | Status: DC

## 2013-09-16 NOTE — Progress Notes (Signed)
[facility], [id number], approved xeloda from 09/16/13-09/17/15 auth # [id number]

## 2013-09-16 NOTE — Pre-Procedure Instructions (Addendum)
Penny Barnes  09/16/2013   Your procedure is scheduled on:  09/18/13  Report to [facility] cone short stay admitting at 530 AM.  Call this number if you have problems the morning of surgery: [phone]   Remember:   Do not eat food or drink liquids after midnight.   Take these medicines the morning of surgery with A SIP OF WATER: compazine if needed         STOP all herbel meds, nsaids (aleve,naproxen,advil,ibuprofen) now including vitamins, aspirin   Do not wear jewelry, make-up or nail polish.  Do not wear lotions, powders, or perfumes. You may wear deodorant.  Do not shave 48 hours prior to surgery. Men may shave face and neck.  Do not bring valuables to the hospital.  [facility] is not responsible                  for any belongings or valuables.               Contacts, dentures or bridgework may not be worn into surgery.  Leave suitcase in the car. After surgery it may be brought to your room.  For patients admitted to the hospital, discharge time is determined by your                treatment team.               Patients discharged the day of surgery will not be allowed to drive  home.  Name and phone number of your driver:   Special Instructions:  Special Instructions: [place] - Preparing for Surgery  Before surgery, you can play an important role.  Because skin is not sterile, your skin needs to be as free of germs as possible.  You can reduce the number of germs on you skin by washing with CHG (chlorahexidine gluconate) soap before surgery.  CHG is an antiseptic cleaner which kills germs and bonds with the skin to continue killing germs even after washing.  Please DO NOT use if you have an allergy to CHG or antibacterial soaps.  If your skin becomes reddened/irritated stop using the CHG and inform your nurse when you arrive at Short Stay.  Do not shave (including legs and underarms) for at least 48 hours prior to the first CHG shower.  You may shave your face.  Please follow  these instructions carefully:   1.  Shower with CHG Soap the night before surgery and the morning of Surgery.  2.  If you choose to wash your hair, wash your hair first as usual with your normal shampoo.  3.  After you shampoo, rinse your hair and body thoroughly to remove the Shampoo.  4.  Use CHG as you would any other liquid soap.  You can apply chg directly  to the skin and wash gently with scrungie or a clean washcloth.  5.  Apply the CHG Soap to your body ONLY FROM THE NECK DOWN.  Do not use on open wounds or open sores.  Avoid contact with your eyes ears, mouth and genitals (private parts).  Wash genitals (private parts)       with your normal soap.  6.  Wash thoroughly, paying special attention to the area where your surgery will be performed.  7.  Thoroughly rinse your body with warm water from the neck down.  8.  DO NOT shower/wash with your normal soap after using and rinsing off the CHG Soap.  9.  Pat yourself dry with a clean towel.            10.  Wear clean pajamas.            11.  Place clean sheets on your bed the night of your first shower and do not sleep with pets.  Day of Surgery  Do not apply any lotions/deodorants the morning of surgery.  Please wear clean clothes to the hospital/surgery center.   Please read over the following fact sheets that you were given: Pain Booklet, Coughing and Deep Breathing and Surgical Site Infection Prevention

## 2013-09-16 NOTE — Telephone Encounter (Signed)
Late entry for 1030: Received message from [facility] that Xeloda requires prior auth. Message forwarded to managed care dept.

## 2013-09-17 ENCOUNTER — Telehealth: Payer: Self-pay | Admitting: *Deleted

## 2013-09-17 MED ORDER — CAPECITABINE 500 MG PO TABS: 1500.0000 mg | ORAL_TABLET | Freq: Two times a day (BID) | ORAL | Status: DC

## 2013-09-17 MED ORDER — CEFAZOLIN SODIUM-DEXTROSE 2-3 GM-% IV SOLR
2.0000 g | INTRAVENOUS | Status: AC
  Administered 2013-09-18: 2 g via INTRAVENOUS
  Filled 2013-09-17: qty 50

## 2013-09-17 NOTE — Telephone Encounter (Signed)
Call from [place] to clarify Rx since it was transferred from another pharmacy. Rx confirmed as written, should be shipped tonight.

## 2013-09-17 NOTE — Telephone Encounter (Signed)
Per staff message and POF I have scheduled appts.  JMW  

## 2013-09-18 ENCOUNTER — Telehealth: Payer: Self-pay | Admitting: Oncology

## 2013-09-18 ENCOUNTER — Encounter (HOSPITAL_COMMUNITY): Payer: 59 | Admitting: Certified Registered Nurse Anesthetist

## 2013-09-18 ENCOUNTER — Encounter (HOSPITAL_COMMUNITY)
Admission: RE | Disposition: A | Discharge status: Home / Self Care | Payer: Self-pay | Source: Ambulatory Visit | Attending: General Surgery

## 2013-09-18 ENCOUNTER — Ambulatory Visit (HOSPITAL_COMMUNITY): Payer: 59

## 2013-09-18 ENCOUNTER — Encounter (HOSPITAL_COMMUNITY): Payer: Self-pay | Admitting: Anesthesiology

## 2013-09-18 ENCOUNTER — Other Ambulatory Visit: Payer: Self-pay | Admitting: Oncology

## 2013-09-18 ENCOUNTER — Ambulatory Visit (HOSPITAL_COMMUNITY): Payer: 59 | Admitting: Certified Registered Nurse Anesthetist

## 2013-09-18 ENCOUNTER — Ambulatory Visit (HOSPITAL_COMMUNITY)
Admission: RE | Admit: 2013-09-18 | Discharge: 2013-09-18 | Disposition: A | Discharge status: Home / Self Care | Payer: 59 | Source: Ambulatory Visit | Attending: General Surgery | Admitting: General Surgery

## 2013-09-18 DIAGNOSIS — Z452 Encounter for adjustment and management of vascular access device: Secondary | ICD-10-CM | POA: Insufficient documentation

## 2013-09-18 DIAGNOSIS — C187 Malignant neoplasm of sigmoid colon: Secondary | ICD-10-CM | POA: Insufficient documentation

## 2013-09-18 DIAGNOSIS — C189 Malignant neoplasm of colon, unspecified: Secondary | ICD-10-CM

## 2013-09-18 HISTORY — PX: PORTACATH PLACEMENT: SHX2246

## 2013-09-18 SURGERY — INSERTION, TUNNELED CENTRAL VENOUS DEVICE, WITH PORT
Anesthesia: Monitor Anesthesia Care | Site: Chest | Laterality: Left

## 2013-09-18 MED ORDER — ACETAMINOPHEN 325 MG PO TABS: 325.0000 mg | ORAL_TABLET | ORAL | Status: DC | PRN

## 2013-09-18 MED ORDER — PROPOFOL 10 MG/ML IV BOLUS
INTRAVENOUS | Status: AC
  Filled 2013-09-18: qty 20

## 2013-09-18 MED ORDER — FENTANYL CITRATE 0.05 MG/ML IJ SOLN
INTRAMUSCULAR | Status: AC
  Filled 2013-09-18: qty 5

## 2013-09-18 MED ORDER — MIDAZOLAM HCL 5 MG/5ML IJ SOLN
INTRAMUSCULAR | Status: DC | PRN
  Administered 2013-09-18: 2 mg via INTRAVENOUS

## 2013-09-18 MED ORDER — ACETAMINOPHEN 160 MG/5ML PO SOLN
325.0000 mg | ORAL | Status: DC | PRN
  Filled 2013-09-18: qty 20.3

## 2013-09-18 MED ORDER — SODIUM BICARBONATE 4 % IV SOLN
INTRAVENOUS | Status: DC | PRN
  Administered 2013-09-18: 5 mL via INTRAVENOUS

## 2013-09-18 MED ORDER — OXYCODONE HCL 5 MG/5ML PO SOLN: 5.0000 mg | Freq: Once | ORAL | Status: DC | PRN

## 2013-09-18 MED ORDER — LIDOCAINE HCL (PF) 1 % IJ SOLN
INTRAMUSCULAR | Status: DC | PRN
  Administered 2013-09-18: 30 mL

## 2013-09-18 MED ORDER — PROPOFOL 10 MG/ML IV BOLUS
INTRAVENOUS | Status: DC | PRN
  Administered 2013-09-18: 40 mg via INTRAVENOUS

## 2013-09-18 MED ORDER — LIDOCAINE HCL (CARDIAC) 20 MG/ML IV SOLN
INTRAVENOUS | Status: AC
  Filled 2013-09-18: qty 5

## 2013-09-18 MED ORDER — PROPOFOL INFUSION 10 MG/ML OPTIME
INTRAVENOUS | Status: DC | PRN
  Administered 2013-09-18: 100 ug/kg/min via INTRAVENOUS

## 2013-09-18 MED ORDER — BUPIVACAINE-EPINEPHRINE (PF) 0.25% -1:200000 IJ SOLN
INTRAMUSCULAR | Status: AC
  Filled 2013-09-18: qty 30

## 2013-09-18 MED ORDER — LACTATED RINGERS IV SOLN
INTRAVENOUS | Status: DC | PRN
  Administered 2013-09-18: 08:00:00 via INTRAVENOUS

## 2013-09-18 MED ORDER — BUPIVACAINE-EPINEPHRINE 0.25% -1:200000 IJ SOLN
INTRAMUSCULAR | Status: DC | PRN
  Administered 2013-09-18: 30 mL

## 2013-09-18 MED ORDER — FENTANYL CITRATE 0.05 MG/ML IJ SOLN: 25.0000 ug | INTRAMUSCULAR | Status: DC | PRN

## 2013-09-18 MED ORDER — HYDROCODONE-ACETAMINOPHEN 5-325 MG PO TABS: 1.0000 | ORAL_TABLET | ORAL | Status: DC | PRN

## 2013-09-18 MED ORDER — MIDAZOLAM HCL 2 MG/2ML IJ SOLN
INTRAMUSCULAR | Status: AC
  Filled 2013-09-18: qty 2

## 2013-09-18 MED ORDER — HEPARIN SOD (PORK) LOCK FLUSH 100 UNIT/ML IV SOLN
INTRAVENOUS | Status: DC | PRN
  Administered 2013-09-18: 500 [IU] via INTRAVENOUS

## 2013-09-18 MED ORDER — KETOROLAC TROMETHAMINE 30 MG/ML IJ SOLN: 15.0000 mg | Freq: Once | INTRAMUSCULAR | Status: DC | PRN

## 2013-09-18 MED ORDER — HEPARIN SODIUM (PORCINE) 5000 UNIT/ML IJ SOLN
INTRAMUSCULAR | Status: DC | PRN
  Administered 2013-09-18: 08:00:00

## 2013-09-18 MED ORDER — LIDOCAINE HCL (CARDIAC) 20 MG/ML IV SOLN
INTRAVENOUS | Status: DC | PRN
  Administered 2013-09-18: 50 mg via INTRAVENOUS

## 2013-09-18 MED ORDER — OXYCODONE HCL 5 MG PO TABS: 5.0000 mg | ORAL_TABLET | Freq: Once | ORAL | Status: DC | PRN

## 2013-09-18 MED ORDER — PROMETHAZINE HCL 25 MG/ML IJ SOLN
INTRAMUSCULAR | Status: AC
  Filled 2013-09-18: qty 1

## 2013-09-18 MED ORDER — LIDOCAINE HCL (PF) 1 % IJ SOLN
INTRAMUSCULAR | Status: AC
  Filled 2013-09-18: qty 30

## 2013-09-18 MED ORDER — ONDANSETRON HCL 4 MG/2ML IJ SOLN: 4.0000 mg | Freq: Once | INTRAMUSCULAR | Status: DC | PRN

## 2013-09-18 MED ORDER — FENTANYL CITRATE 0.05 MG/ML IJ SOLN
INTRAMUSCULAR | Status: DC | PRN
  Administered 2013-09-18 (×2): 25 ug via INTRAVENOUS

## 2013-09-18 MED ORDER — HEPARIN SOD (PORK) LOCK FLUSH 100 UNIT/ML IV SOLN
INTRAVENOUS | Status: AC
  Filled 2013-09-18: qty 5

## 2013-09-18 MED ORDER — PROMETHAZINE HCL 25 MG/ML IJ SOLN
INTRAMUSCULAR | Status: DC | PRN
  Administered 2013-09-18: 12.5 mg via INTRAVENOUS

## 2013-09-18 MED ORDER — SODIUM BICARBONATE 4 % IV SOLN
INTRAVENOUS | Status: AC
  Filled 2013-09-18: qty 5

## 2013-09-18 SURGICAL SUPPLY — 58 items
BAG DECANTER FOR FLEXI CONT (MISCELLANEOUS) ×2 IMPLANT
BLADE SURG 15 STRL LF DISP TIS (BLADE) ×1 IMPLANT
BLADE SURG 15 STRL SS (BLADE) ×1
CHLORAPREP W/TINT 10.5 ML (MISCELLANEOUS) ×2 IMPLANT
COVER SURGICAL LIGHT HANDLE (MISCELLANEOUS) ×2 IMPLANT
CRADLE DONUT ADULT HEAD (MISCELLANEOUS) ×2 IMPLANT
DECANTER SPIKE VIAL GLASS SM (MISCELLANEOUS) IMPLANT
DERMABOND ADVANCED (GAUZE/BANDAGES/DRESSINGS) ×1
DERMABOND ADVANCED .7 DNX12 (GAUZE/BANDAGES/DRESSINGS) ×1 IMPLANT
DRAPE C-ARM 42X72 X-RAY (DRAPES) ×2 IMPLANT
DRAPE CHEST BREAST 15X10 FENES (DRAPES) ×2 IMPLANT
DRAPE UTILITY 15X26 W/TAPE STR (DRAPE) ×4 IMPLANT
DRSG TEGADERM 4X4.75 (GAUZE/BANDAGES/DRESSINGS) ×2 IMPLANT
ELECT CAUTERY BLADE 6.4 (BLADE) ×2 IMPLANT
ELECT REM PT RETURN 9FT ADLT (ELECTROSURGICAL) ×2
ELECTRODE REM PT RTRN 9FT ADLT (ELECTROSURGICAL) ×1 IMPLANT
GAUZE SPONGE 2X2 8PLY STRL LF (GAUZE/BANDAGES/DRESSINGS) ×1 IMPLANT
GAUZE SPONGE 4X4 16PLY XRAY LF (GAUZE/BANDAGES/DRESSINGS) ×2 IMPLANT
GLOVE BIO SURGEON STRL SZ7.5 (GLOVE) ×2 IMPLANT
GLOVE BIOGEL PI IND STRL 7.5 (GLOVE) ×2 IMPLANT
GLOVE BIOGEL PI IND STRL 8 (GLOVE) ×1 IMPLANT
GLOVE BIOGEL PI INDICATOR 7.5 (GLOVE) ×2
GLOVE BIOGEL PI INDICATOR 8 (GLOVE) ×1
GLOVE ECLIPSE 6.5 STRL STRAW (GLOVE) ×2 IMPLANT
GLOVE SS BIOGEL STRL SZ 7.5 (GLOVE) ×1 IMPLANT
GLOVE SUPERSENSE BIOGEL SZ 7.5 (GLOVE) ×1
GOWN PREVENTION PLUS XLARGE (GOWN DISPOSABLE) ×2 IMPLANT
GOWN STRL NON-REIN LRG LVL3 (GOWN DISPOSABLE) ×2 IMPLANT
GOWN STRL REIN XL XLG (GOWN DISPOSABLE) ×2 IMPLANT
INTRODUCER COOK 11FR (CATHETERS) IMPLANT
IV CATH 14GX2 1/4 (CATHETERS) IMPLANT
KIT BASIN OR (CUSTOM PROCEDURE TRAY) ×2 IMPLANT
KIT PORT POWER 8FR ISP CVUE (Catheter) ×2 IMPLANT
KIT PORT POWER 9.6FR MRI PREA (Catheter) IMPLANT
KIT PORT POWER ISP 8FR (Catheter) IMPLANT
KIT POWER CATH 8FR (Catheter) IMPLANT
KIT ROOM TURNOVER OR (KITS) ×2 IMPLANT
NEEDLE 22X1 1/2 (OR ONLY) (NEEDLE) ×2 IMPLANT
NEEDLE HYPO 25GX1X1/2 BEV (NEEDLE) ×2 IMPLANT
NS IRRIG 1000ML POUR BTL (IV SOLUTION) ×2 IMPLANT
PACK SURGICAL SETUP 50X90 (CUSTOM PROCEDURE TRAY) ×2 IMPLANT
PAD ARMBOARD 7.5X6 YLW CONV (MISCELLANEOUS) ×4 IMPLANT
PENCIL BUTTON HOLSTER BLD 10FT (ELECTRODE) ×2 IMPLANT
SET INTRODUCER 12FR PACEMAKER (SHEATH) IMPLANT
SET SHEATH INTRODUCER 10FR (MISCELLANEOUS) IMPLANT
SHEATH COOK PEEL AWAY SET 9F (SHEATH) IMPLANT
SPONGE GAUZE 2X2 STER 10/PKG (GAUZE/BANDAGES/DRESSINGS) ×1
SUT MON AB 5-0 PS2 18 (SUTURE) ×2 IMPLANT
SUT PROLENE 2 0 SH DA (SUTURE) ×2 IMPLANT
SUT SILK 2 0 (SUTURE)
SUT SILK 2-0 18XBRD TIE 12 (SUTURE) IMPLANT
SYR 20ML ECCENTRIC (SYRINGE) ×4 IMPLANT
SYR 5ML LUER SLIP (SYRINGE) ×2 IMPLANT
SYR BULB 3OZ (MISCELLANEOUS) ×2 IMPLANT
SYR CONTROL 10ML LL (SYRINGE) ×2 IMPLANT
TOWEL OR 17X24 6PK STRL BLUE (TOWEL DISPOSABLE) ×2 IMPLANT
TOWEL OR 17X26 10 PK STRL BLUE (TOWEL DISPOSABLE) ×2 IMPLANT
WATER STERILE IRR 1000ML POUR (IV SOLUTION) IMPLANT

## 2013-09-18 NOTE — Telephone Encounter (Signed)
talked to pt and gave her appt for lab,md and chemo for March 2015

## 2013-09-18 NOTE — Anesthesia Postprocedure Evaluation (Signed)
  Anesthesia Post-op Note  Patient: Penny Barnes  Procedure(s) Performed: Procedure(s): INSERTION PORT-A-CATH (Left)  Patient Location: PACU  Anesthesia Type:MAC  Level of Consciousness: awake, alert  and oriented  Airway and Oxygen Therapy: Patient Spontanous Breathing  Post-op Pain: mild  Post-op Assessment: Post-op Vital signs reviewed, Patient's Cardiovascular Status Stable, Respiratory Function Stable, Patent Airway, No signs of Nausea or vomiting and Pain level controlled  Post-op Vital Signs: Reviewed and stable  Complications: No apparent anesthesia complications

## 2013-09-18 NOTE — Discharge Instructions (Signed)
    PORT-A-CATH: POST OP INSTRUCTIONS  Always review your discharge instruction sheet given to you by the facility where your surgery was performed.   1. A prescription for pain medication may be given to you upon discharge. Take your pain medication as prescribed, if needed. If narcotic pain medicine is not needed, then you make take acetaminophen (Tylenol) or ibuprofen (Advil) as needed.  2. Take your usually prescribed medications unless otherwise directed. 3. If you need a refill on your pain medication, please contact our office. All narcotic pain medicine now requires a paper prescription.  Phoned in and fax refills are no longer allowed by law.  Prescriptions will not be filled after 5 pm or on weekends.  4. You should follow a light diet for the remainder of the day after your procedure. 5. Most patients will experience some mild swelling and/or bruising in the area of the incision. It may take several days to resolve. 6. It is common to experience some constipation if taking pain medication after surgery. Increasing fluid intake and taking a stool softener (such as Colace) will usually help or prevent this problem from occurring. A mild laxative (Milk of Magnesia or Miralax) should be taken according to package directions if there are no bowel movements after 48 hours.  7. Unless discharge instructions indicate otherwise, you may remove your bandages 48 hours after surgery, and you may shower at that time. You may have steri-strips (small white skin tapes) in place directly over the incision.  These strips should be left on the skin for 7-10 days.  If your surgeon used Dermabond (skin glue) on the incision, you may shower in 24 hours.  The glue will flake off over the next 2-3 weeks.  8. If your port is left accessed at the end of surgery (needle left in port), the dressing cannot get wet and should only by changed by a healthcare professional. When the port is no longer accessed (when the  needle has been removed), follow step 7.   9. ACTIVITIES:  Limit activity involving your arms for the next 72 hours. Do no strenuous exercise or activity for 1 week. You may drive when you are no longer taking prescription pain medication, you can comfortably wear a seatbelt, and you can maneuver your car. 10.You may need to see your doctor in the office for a follow-up appointment.  Please       check with your doctor.  11.When you receive a new Port-a-Cath, you will get a product guide and        ID card.  Please keep them in case you need them.  WHEN TO CALL YOUR DOCTOR ([phone]): 1. Fever over 101.0 2. Chills 3. Continued bleeding from incision 4. Increased redness and tenderness at the site 5. Shortness of breath, difficulty breathing   The clinic staff is available to answer your questions during regular business hours. Please don't hesitate to call and ask to speak to one of the nurses or medical assistants for clinical concerns. If you have a medical emergency, go to the nearest emergency room or call 911.  A surgeon from [facility] is always on call at the hospital.     For further information, please visit [email or web address]      

## 2013-09-18 NOTE — Op Note (Signed)
Preoperative diagnosis: Cancer of the coclon poor venous access  Postoperative diagnosis: Same  Procedure: Placement of ClearVue subcutaneous venous port  Surgeon: Excell Seltzer M.D.  Anesthesia: Local with IV sedation  Description of procedure: Patient is brought to the operating room and placed in the supine position on the operating table. IV sedation was administered. The entire upper chest and neck were widely sterilely prepped and draped. Local anesthesia was used to infiltrate the insertion of port site. The left subclavian vein was cannulated with a needle and guidewire without difficulty and position in the superior vena cava was confirmed by fluoroscopy. The introducer was then placed over the guidewire and the flushed catheter placed via the introducer which was stripped away and the tip of the catheter positioned near the cavoatrial junction. A small transverse incision was made in the anterior chest wall and subcutaneous pocket created. The catheter was tunneled into the pocket, trimmed to length, and attached to the flushed port which was positioned in the pocket. The port was sutured to the chest wall with interrupted 2-0 Prolene. The incisions were closed with subcutaneous interrupted Monocryl and the skin incisions closed with subcuticular Monocryl and Dermabond. The port was accessed and flushed and aspirated easily and was left flushed with concentrated heparin solution. Sponge needle as the counts were correct. The patient was taken to recovery in good condition.  Penny Barnes  09/18/2013

## 2013-09-18 NOTE — H&P (View-Only) (Signed)
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 She is coming along quite well from surgery. Bowel movements are a little more frequent and loose than preop but she denies any abdominal pain or nausea or distention. No fever or chills. She has seen Dr. Sherrill and chemotherapy is planned. We plan Port-A-Cath placement next week.  Exam: BP 130/78  Pulse 80  Temp(Src) 97.2 F (36.2 C) (Temporal)  Resp 16  Ht 5' 3" (1.6 m)  Wt 138 lb 12.8 oz (62.959 kg)  BMI 24.59 kg/m2 General: Appears well Abdomen: Soft and nontender. Incisions all healing well.  Assessment and plan: Doing well following colectomy without complication. The plan Port-A-Cath placement next week. I discussed the nature of the procedure and indications as well as risks of bleeding, infection, pneumothorax, catheter malfunction or displacement and DVT. All her questions were answered. 

## 2013-09-18 NOTE — Interval H&P Note (Signed)
History and Physical Interval Note:  09/18/2013 8:29 AM  Penny Barnes  has presented today for surgery, with the diagnosis of colon cancer  The various methods of treatment have been discussed with the patient and family. After consideration of risks, benefits and other options for treatment, the patient has consented to  Procedure(s): INSERTION PORT-A-CATH (Left) as a surgical intervention .  The patient's history has been reviewed, patient examined, no change in status, stable for surgery.  I have reviewed the patient's chart and labs.  Questions were answered to the patient's satisfaction.     [name] T

## 2013-09-18 NOTE — Progress Notes (Signed)
Called for sign out

## 2013-09-18 NOTE — Transfer of Care (Signed)
Immediate Anesthesia Transfer of Care Note  Patient: Penny Barnes  Procedure(s) Performed: Procedure(s): INSERTION PORT-A-CATH (Left)  Patient Location: PACU  Anesthesia Type:General  Level of Consciousness: awake, alert  and oriented  Airway & Oxygen Therapy: Patient Spontanous Breathing  Post-op Assessment: Report given to PACU RN, Post -op Vital signs reviewed and stable and Patient moving all extremities  Post vital signs: Reviewed and stable  Complications: No apparent anesthesia complications

## 2013-09-18 NOTE — Anesthesia Preprocedure Evaluation (Signed)
Anesthesia Evaluation  Patient identified by MRN, date of birth, ID band Patient awake    Reviewed: Allergy & Precautions, H&P , NPO status , Patient's Chart, lab work & pertinent test results  History of Anesthesia Complications (+) PONV and history of anesthetic complications  Airway Mallampati: II TM Distance: >3 FB Neck ROM: Full    Dental  (+) Teeth Intact   Pulmonary neg pulmonary ROS,  breath sounds clear to auscultation        Cardiovascular negative cardio ROS  Rhythm:Regular Rate:Normal     Neuro/Psych negative neurological ROS  negative psych ROS   GI/Hepatic Neg liver ROS, Colon cancer    Endo/Other  negative endocrine ROS  Renal/GU negative Renal ROS     Musculoskeletal   Abdominal   Peds  Hematology negative hematology ROS (+)   Anesthesia Other Findings   Reproductive/Obstetrics                           Anesthesia Physical Anesthesia Plan  ASA: II  Anesthesia Plan: MAC   Post-op Pain Management:    Induction: Intravenous  Airway Management Planned: Natural Airway  Additional Equipment: None  Intra-op Plan:   Post-operative Plan:   Informed Consent: I have reviewed the patients History and Physical, chart, labs and discussed the procedure including the risks, benefits and alternatives for the proposed anesthesia with the patient or authorized representative who has indicated his/her understanding and acceptance.   Dental advisory given  Plan Discussed with: CRNA and Surgeon  Anesthesia Plan Comments:         Anesthesia Quick Evaluation

## 2013-09-21 ENCOUNTER — Other Ambulatory Visit: Payer: Self-pay | Admitting: Emergency Medicine

## 2013-09-21 ENCOUNTER — Ambulatory Visit: Payer: 59 | Admitting: Nutrition

## 2013-09-21 ENCOUNTER — Ambulatory Visit (HOSPITAL_BASED_OUTPATIENT_CLINIC_OR_DEPARTMENT_OTHER): Payer: 59

## 2013-09-21 VITALS — BP 135/64 | HR 75 | Temp 97.4°F | Resp 18

## 2013-09-21 DIAGNOSIS — Z5111 Encounter for antineoplastic chemotherapy: Secondary | ICD-10-CM

## 2013-09-21 DIAGNOSIS — C187 Malignant neoplasm of sigmoid colon: Secondary | ICD-10-CM

## 2013-09-21 MED ORDER — SODIUM CHLORIDE 0.9 % IJ SOLN
10.0000 mL | INTRAMUSCULAR | Status: DC | PRN
  Administered 2013-09-21: 10 mL
  Filled 2013-09-21: qty 10

## 2013-09-21 MED ORDER — OXALIPLATIN CHEMO INJECTION 100 MG/20ML
130.0000 mg/m2 | Freq: Once | INTRAVENOUS | Status: AC
  Administered 2013-09-21: 215 mg via INTRAVENOUS
  Filled 2013-09-21: qty 43

## 2013-09-21 MED ORDER — LIDOCAINE-PRILOCAINE 2.5-2.5 % EX CREA: 1.0000 "application " | TOPICAL_CREAM | CUTANEOUS | Status: DC | PRN

## 2013-09-21 MED ORDER — DEXAMETHASONE SODIUM PHOSPHATE 10 MG/ML IJ SOLN
INTRAMUSCULAR | Status: AC
  Filled 2013-09-21: qty 1

## 2013-09-21 MED ORDER — ONDANSETRON 8 MG/50ML IVPB (CHCC)
8.0000 mg | Freq: Once | INTRAVENOUS | Status: AC
  Administered 2013-09-21: 8 mg via INTRAVENOUS

## 2013-09-21 MED ORDER — ONDANSETRON 8 MG/NS 50 ML IVPB
INTRAVENOUS | Status: AC
  Filled 2013-09-21: qty 8

## 2013-09-21 MED ORDER — DEXAMETHASONE SODIUM PHOSPHATE 10 MG/ML IJ SOLN
10.0000 mg | Freq: Once | INTRAMUSCULAR | Status: AC
  Administered 2013-09-21: 10 mg via INTRAVENOUS

## 2013-09-21 MED ORDER — HEPARIN SOD (PORK) LOCK FLUSH 100 UNIT/ML IV SOLN
500.0000 [IU] | Freq: Once | INTRAVENOUS | Status: AC | PRN
  Administered 2013-09-21: 500 [IU]
  Filled 2013-09-21: qty 5

## 2013-09-21 MED ORDER — DEXTROSE 5 % IV SOLN
Freq: Once | INTRAVENOUS | Status: AC
  Administered 2013-09-21: 13:00:00 via INTRAVENOUS

## 2013-09-21 NOTE — Patient Instructions (Signed)
[facility] Discharge Instructions for Patients Receiving Chemotherapy  Today you received the following chemotherapy agents Oxaliplatin.  To help prevent nausea and vomiting after your treatment, we encourage you to take your nausea medication.   If you develop nausea and vomiting that is not controlled by your nausea medication, call the clinic.   BELOW ARE SYMPTOMS THAT SHOULD BE REPORTED IMMEDIATELY:  *FEVER GREATER THAN 100.5 F  *CHILLS WITH OR WITHOUT FEVER  NAUSEA AND VOMITING THAT IS NOT CONTROLLED WITH YOUR NAUSEA MEDICATION  *UNUSUAL SHORTNESS OF BREATH  *UNUSUAL BRUISING OR BLEEDING  TENDERNESS IN MOUTH AND THROAT WITH OR WITHOUT PRESENCE OF ULCERS  *URINARY PROBLEMS  *BOWEL PROBLEMS  UNUSUAL RASH Items with * indicate a potential emergency and should be followed up as soon as possible.  Feel free to call the clinic you have any questions or concerns. The clinic phone number is [phone].    

## 2013-09-21 NOTE — Progress Notes (Signed)
Patient is a 53 year old female diagnosed with colon cancer.  She is status post colectomy.  She is a patient of Dr. Benay Spice.  Patient has history of postop nausea and vomiting.  Medications include Xeloda and Compazine.  Labs include glucose 156.  Height: 63 inches. Weight: 140.2 pounds March 4. Usual body weight: 142 pounds. BMI: 24.84.  Patient is receiving her first treatment today.  She is most concerned with potential nausea.  She wavers between constipation and diarrhea.  She typically experiences constipation prior to diagnosis.  Patient generally eats well.  Nutrition diagnosis: Food and nutrition related knowledge deficit related to new diagnosis of colon cancer and associated treatments as evidenced by no prior need for nutrition related information.  Intervention: Patient was educated on strategies for eating small, frequent meals and snacks throughout the day.  Adequate calories and protein to promote weight maintenance.  Patient was educated on foods to consume and to avoid, if she develops nausea and vomiting.  Brief education given on oxaliplatin and food she may consume directly after treatment.  Patient was provided with fact sheets. Questions were answered.  Teach back method used.  Monitoring, evaluation, goals: Patient will tolerate adequate calories and protein to promote weight maintenance. Minimal side effects throughout treatment.  Next visit: Monday, March 30, during chemotherapy.

## 2013-09-22 ENCOUNTER — Encounter (HOSPITAL_COMMUNITY): Payer: Self-pay | Admitting: General Surgery

## 2013-09-23 ENCOUNTER — Other Ambulatory Visit: Payer: Self-pay | Admitting: *Deleted

## 2013-09-23 ENCOUNTER — Telehealth: Payer: Self-pay | Admitting: *Deleted

## 2013-09-23 DIAGNOSIS — C187 Malignant neoplasm of sigmoid colon: Secondary | ICD-10-CM

## 2013-09-23 MED ORDER — ONDANSETRON 8 MG PO TBDP: 8.0000 mg | ORAL_TABLET | Freq: Three times a day (TID) | ORAL | Status: DC | PRN

## 2013-09-23 NOTE — Telephone Encounter (Signed)
Nausea is likely related to [place] and not Xeloda. She can try zofran ODT 8 mg every 8 hours as needed. Call if not effective.

## 2013-09-23 NOTE — Telephone Encounter (Addendum)
   Provider input needed: Nausea, vomiting, anti-emetic, Xeloda   Reason for call: Nauseated and vomiting and today vomited and may have lost Xeloda tablets, asking for a different anti-emetic  Gastrointestinal: positive for nausea and vomiting   Patient last received chemotherapy/ treatment on 09-21-2013 1st CAPOX  Patient was last seen in the office on 09-07-2013 new patient of Dr. Benay Spice  Next appt is 10-12-2013 with APP  Is patient having fevers greater than 100.5?  no   Is patient having uncontrolled pain, or new pain? no   Is patient having new back pain that changes with position (worsens or eases when laying down?)  no   Is patient able to eat and drink? Reports she is "drinking with no problems and I do not think I am dehydrated" Nauseated 09-22-2013 and vomited that night.  This morning bile came up, ate cereal, waited thirty minutes, took Xeloda and saw the cereal, did not see capsules but not sure what to do.    Is patient able to pass stool without difficulty?   yes, "I may be a little constipated because this morning I passed a tiny amount of soft stool.  Had good bm Monday morning.     Is patient having uncontrolled nausea?  yes, "I don't like the feeling of being nauseated."    patient calls 09/23/2013 with complaint of  Gastrointestinal: positive for nausea and vomiting   Summary Based on the above information advised patient to increase fluid intake to help bowels and avoid dehydration.  Will notify providers of n/v and xeloda concerns and call her with instructions at [phone].   Winston-Spruiell, [name]  09/23/2013, 8:30 AM   Background Info  Penny Barnes   DOB: August 31, 1960   MR#: [id number]   CSN#   [id number] 09/23/2013

## 2013-09-23 NOTE — Telephone Encounter (Signed)
Information and new order called to patient.  Verbal order received and read back from Penny Barnes for patient not to repeat the morning dose of xeloda.  Instructed to call 8:00 am till 4:30 pm if this is not effective and the after hours C.A.N.

## 2013-09-29 ENCOUNTER — Telehealth: Payer: Self-pay | Admitting: *Deleted

## 2013-09-29 NOTE — Telephone Encounter (Signed)
Pt called with several questions:  Pt reports that she "stills feel a little cold sensitivity in the back of my throat when I swallow"  Pt denies swallowing difficulty, "just sensitive"  Encouraged pt to continue to drink plenty of fluids, informed her in some pts this can range from 3-5 days or longer from 5-7 days and hydration is important.  Pt states that she has been taking Zofran and Compazine for nausea and vomiting since her first treatment (09/21/13);  She did have n/v on 09/23/13 and was given a new script for Zofran at that time and it has helped "should I keep taking both; the compazine doesn't really help that much?" Pt states she is feeling well; eating/drinking well; no recent n/v issues.  "I feel the best now than I have been"  Instructed pt to try not take any anti-emetics and see how she feels and IF nausous to take Zofran. Pt states that she has not had BM since Sunday but "my eating has picked up over the last couple of day"  Pt reports she is increasing her fiber; eating fruits and just wanted ask what she should do next.  Instructed pt to wait and see how she does next couple of days; continue to add fiber to intake and see if she has BM with increased intake and if she doesn't have BM towards end of week to call office or can take Miralax OTC.  Pt verbalized understanding of all instructions and confirmed appt for 10/12/13 and understanding to call if any further questions.

## 2013-10-05 ENCOUNTER — Inpatient Hospital Stay: Payer: 59

## 2013-10-09 ENCOUNTER — Encounter: Payer: Self-pay | Admitting: Oncology

## 2013-10-09 NOTE — Progress Notes (Signed)
Put fmla form on nurse's desk °

## 2013-10-11 ENCOUNTER — Other Ambulatory Visit: Payer: Self-pay | Admitting: Oncology

## 2013-10-12 ENCOUNTER — Other Ambulatory Visit: Payer: Self-pay | Admitting: *Deleted

## 2013-10-12 ENCOUNTER — Ambulatory Visit (HOSPITAL_BASED_OUTPATIENT_CLINIC_OR_DEPARTMENT_OTHER): Payer: 59

## 2013-10-12 ENCOUNTER — Ambulatory Visit (HOSPITAL_BASED_OUTPATIENT_CLINIC_OR_DEPARTMENT_OTHER): Payer: 59 | Admitting: Nurse Practitioner

## 2013-10-12 ENCOUNTER — Other Ambulatory Visit (HOSPITAL_BASED_OUTPATIENT_CLINIC_OR_DEPARTMENT_OTHER): Payer: 59

## 2013-10-12 ENCOUNTER — Ambulatory Visit: Payer: 59 | Admitting: Nutrition

## 2013-10-12 ENCOUNTER — Telehealth: Payer: Self-pay | Admitting: Oncology

## 2013-10-12 VITALS — BP 131/75 | HR 87 | Temp 97.7°F | Resp 20

## 2013-10-12 VITALS — BP 142/62 | HR 80 | Temp 98.0°F | Resp 18 | Ht 63.0 in | Wt 138.6 lb

## 2013-10-12 DIAGNOSIS — C187 Malignant neoplasm of sigmoid colon: Secondary | ICD-10-CM

## 2013-10-12 DIAGNOSIS — R110 Nausea: Secondary | ICD-10-CM

## 2013-10-12 DIAGNOSIS — Z5111 Encounter for antineoplastic chemotherapy: Secondary | ICD-10-CM

## 2013-10-12 DIAGNOSIS — C772 Secondary and unspecified malignant neoplasm of intra-abdominal lymph nodes: Secondary | ICD-10-CM

## 2013-10-12 LAB — COMPREHENSIVE METABOLIC PANEL (CC13)
ALT: 17 U/L (ref 0–55)
ANION GAP: 10 meq/L (ref 3–11)
AST: 16 U/L (ref 5–34)
Albumin: 4.3 g/dL (ref 3.5–5.0)
Alkaline Phosphatase: 75 U/L (ref 40–150)
BUN: 15.4 mg/dL (ref 7.0–26.0)
CALCIUM: 9.5 mg/dL (ref 8.4–10.4)
CHLORIDE: 106 meq/L (ref 98–109)
CO2: 25 meq/L (ref 22–29)
Creatinine: 0.7 mg/dL (ref 0.6–1.1)
Glucose: 86 mg/dl (ref 70–140)
Potassium: 3.9 mEq/L (ref 3.5–5.1)
SODIUM: 140 meq/L (ref 136–145)
TOTAL PROTEIN: 7.4 g/dL (ref 6.4–8.3)
Total Bilirubin: 0.45 mg/dL (ref 0.20–1.20)

## 2013-10-12 LAB — CBC WITH DIFFERENTIAL/PLATELET
BASO%: 0.3 % (ref 0.0–2.0)
Basophils Absolute: 0 10*3/uL (ref 0.0–0.1)
EOS ABS: 0.1 10*3/uL (ref 0.0–0.5)
EOS%: 3.1 % (ref 0.0–7.0)
HCT: 39.3 % (ref 34.8–46.6)
HGB: 13.4 g/dL (ref 11.6–15.9)
LYMPH%: 30.6 % (ref 14.0–49.7)
MCH: 30.4 pg (ref 25.1–34.0)
MCHC: 34.1 g/dL (ref 31.5–36.0)
MCV: 89.1 fL (ref 79.5–101.0)
MONO#: 0.4 10*3/uL (ref 0.1–0.9)
MONO%: 12.2 % (ref 0.0–14.0)
NEUT#: 1.7 10*3/uL (ref 1.5–6.5)
NEUT%: 53.8 % (ref 38.4–76.8)
NRBC: 0 % (ref 0–0)
Platelets: 209 10*3/uL (ref 145–400)
RBC: 4.41 10*6/uL (ref 3.70–5.45)
RDW: 14.3 % (ref 11.2–14.5)
WBC: 3.2 10*3/uL — AB (ref 3.9–10.3)
lymph#: 1 10*3/uL (ref 0.9–3.3)

## 2013-10-12 MED ORDER — PALONOSETRON HCL INJECTION 0.25 MG/5ML
0.2500 mg | Freq: Once | INTRAVENOUS | Status: AC
  Administered 2013-10-12: 0.25 mg via INTRAVENOUS

## 2013-10-12 MED ORDER — OXALIPLATIN CHEMO INJECTION 100 MG/20ML
130.0000 mg/m2 | Freq: Once | INTRAVENOUS | Status: AC
  Administered 2013-10-12: 215 mg via INTRAVENOUS
  Filled 2013-10-12: qty 43

## 2013-10-12 MED ORDER — DEXTROSE 5 % IV SOLN
Freq: Once | INTRAVENOUS | Status: AC
  Administered 2013-10-12: 12:00:00 via INTRAVENOUS

## 2013-10-12 MED ORDER — SODIUM CHLORIDE 0.9 % IJ SOLN
10.0000 mL | INTRAMUSCULAR | Status: DC | PRN
  Administered 2013-10-12: 10 mL
  Filled 2013-10-12: qty 10

## 2013-10-12 MED ORDER — HEPARIN SOD (PORK) LOCK FLUSH 100 UNIT/ML IV SOLN
500.0000 [IU] | Freq: Once | INTRAVENOUS | Status: AC | PRN
  Administered 2013-10-12: 500 [IU]
  Filled 2013-10-12: qty 5

## 2013-10-12 MED ORDER — DEXAMETHASONE SODIUM PHOSPHATE 10 MG/ML IJ SOLN
INTRAMUSCULAR | Status: AC
  Filled 2013-10-12: qty 1

## 2013-10-12 MED ORDER — PALONOSETRON HCL INJECTION 0.25 MG/5ML
INTRAVENOUS | Status: AC
  Filled 2013-10-12: qty 5

## 2013-10-12 MED ORDER — LORAZEPAM 0.5 MG PO TABS: ORAL_TABLET | ORAL | Status: DC

## 2013-10-12 MED ORDER — DEXAMETHASONE SODIUM PHOSPHATE 10 MG/ML IJ SOLN
10.0000 mg | Freq: Once | INTRAMUSCULAR | Status: AC
  Administered 2013-10-12: 10 mg via INTRAVENOUS

## 2013-10-12 NOTE — Progress Notes (Signed)
Patient reports nausea after chemotherapy.  Compazine did not work very well.  However, Zofran, was effective.  Noted, M.D. adding Aloxi and Ativan, today.  Patient reports alternating between diarrhea, and constipation.  She was able to eat some however weight has decreased to 138.6 pounds on March 30 from 140.2 pounds March 4.  Patient complaining of taste alterations.  Nutrition diagnosis: Food and nutrition related knowledge deficit improved.  Intervention: Patient educated on strategies for improving nausea and oral intake.  Patient educated on strategies for preventing constipation.  Recommended patient increase fluid intake. I also educated patient on strategies for improving taste alterations.  Fact sheets were provided.  Questions were answered.  Teach back method used.  Monitoring, evaluation, goals: Patient will tolerate increased oral intake to minimize further weight loss.  Next visit: Monday, April 20, during chemotherapy.

## 2013-10-12 NOTE — Progress Notes (Signed)
Pt saw Lattie Haw, NP today prior to chemo.  OK to treat pt with WBC  3.2 as per NP. Pt completed Oxaliplatin infusion.  At start of D5W flushing, pt complained of slight scratchy throat, slight numbness on lips.  Denied short of breath, denied pain.  VSS.  Dr. Benay Spice notified.  No new orders.  Explanations given to pt and husband at bedside. Pt was stable at discharge with husband via ambulation.

## 2013-10-12 NOTE — Patient Instructions (Signed)
[place] Discharge Instructions for Patients Receiving Chemotherapy  Today you received the following chemotherapy agents :  Oxaliplatin.  To help prevent nausea and vomiting after your treatment, we encourage you to take your nausea medication as instructed by your physician.   If you develop nausea and vomiting that is not controlled by your nausea medication, call the clinic.   BELOW ARE SYMPTOMS THAT SHOULD BE REPORTED IMMEDIATELY:  *FEVER GREATER THAN 100.5 F  *CHILLS WITH OR WITHOUT FEVER  NAUSEA AND VOMITING THAT IS NOT CONTROLLED WITH YOUR NAUSEA MEDICATION  *UNUSUAL SHORTNESS OF BREATH  *UNUSUAL BRUISING OR BLEEDING  TENDERNESS IN MOUTH AND THROAT WITH OR WITHOUT PRESENCE OF ULCERS  *URINARY PROBLEMS  *BOWEL PROBLEMS  UNUSUAL RASH Items with * indicate a potential emergency and should be followed up as soon as possible.  Feel free to call the clinic you have any questions or concerns. The clinic phone number is [phone].

## 2013-10-12 NOTE — Telephone Encounter (Signed)
GV AND PRINTED APPT SCHED AND AVS FOR PT FOR aPRIL AND MAY....SED ADDED TX....Marland KitchenPT AWARE OF 4.10.15 LAB

## 2013-10-12 NOTE — Progress Notes (Signed)
  [place] OFFICE PROGRESS NOTE   Diagnosis:  Stage III colon cancer.  INTERVAL HISTORY:   She returns as scheduled. She completed cycle 1 CAPOX beginning 09/21/2013. She developed nausea/vomiting on day 3. Compazine was not effective. Zofran was effective. She describes bowel habits as "irregular". She has diarrhea alternating with constipation. She had "gas pain". No mouth sores. The cold sensitivity lasted about 7 days. She noted a persistent abnormal sensation at the back of the throat for several additional days.  Objective:  Vital signs in last 24 hours:  Blood pressure 142/62, pulse 80, temperature 98 F (36.7 C), temperature source Oral, resp. rate 18, height _0  (1.6 m), weight 138 lb 9.6 oz (62.869 kg), SpO2 100.00%.    HEENT: No thrush or ulcerations. Resp: Lungs clear. Cardio: Regular cardiac rhythm. GI: Abdomen soft and nontender. No organomegaly. Vascular: No leg edema. Neuro: Vibratory sense intact over the fingertips per tuning fork exam.    Portacath/PICC-without erythema.  Lab Results:  Lab Results  Component Value Date   WBC 3.2* 10/12/2013   HGB 13.4 10/12/2013   HCT 39.3 10/12/2013   MCV 89.1 10/12/2013   PLT 209 10/12/2013   NEUTROABS 1.7 10/12/2013     Lab Results  Component Value Date   CEA 0.7 08/20/2013    Imaging:  No results found.  Medications: I have reviewed the patient's current medications.  Assessment/Plan: 1. 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   Cycle 1 adjuvant CAPOX 09/21/2013. 2. Delayed nausea following cycle 1 CAPOX. Aloxi will be added beginning with cycle 2.   Disposition: she appears stable. She has completed one cycle of CAPOX. Plan to proceed with cycle 2 today as scheduled.   She had delayed nausea  following cycle 1. The premedication regimen will be adjusted to include Aloxi. We called a prescription to her pharmacy for Ativan 0.5 mg every 8 hours as needed for nausea. She will contact the office if she has nausea despite these measures.  The white count is mildly decreased. She will return for a nadir CBC on 10/23/2013. She understands to contact the office with fever, chills, other signs of infection.  We will see her in followup in 3 weeks.   Plan reviewed with Dr. Benay Spice.    Ned Card ANP/GNP-BC   10/12/2013  11:35 AM

## 2013-10-23 ENCOUNTER — Other Ambulatory Visit (HOSPITAL_BASED_OUTPATIENT_CLINIC_OR_DEPARTMENT_OTHER): Payer: 59

## 2013-10-23 DIAGNOSIS — C187 Malignant neoplasm of sigmoid colon: Secondary | ICD-10-CM

## 2013-10-23 DIAGNOSIS — C772 Secondary and unspecified malignant neoplasm of intra-abdominal lymph nodes: Secondary | ICD-10-CM

## 2013-10-23 LAB — CBC WITH DIFFERENTIAL/PLATELET
BASO%: 0.8 % (ref 0.0–2.0)
Basophils Absolute: 0 10*3/uL (ref 0.0–0.1)
EOS%: 11.5 % — AB (ref 0.0–7.0)
Eosinophils Absolute: 0.6 10*3/uL — ABNORMAL HIGH (ref 0.0–0.5)
HCT: 38 % (ref 34.8–46.6)
HGB: 13 g/dL (ref 11.6–15.9)
LYMPH#: 1.1 10*3/uL (ref 0.9–3.3)
LYMPH%: 22.2 % (ref 14.0–49.7)
MCH: 30.8 pg (ref 25.1–34.0)
MCHC: 34.1 g/dL (ref 31.5–36.0)
MCV: 90.5 fL (ref 79.5–101.0)
MONO#: 0.3 10*3/uL (ref 0.1–0.9)
MONO%: 6.8 % (ref 0.0–14.0)
NEUT#: 2.9 10*3/uL (ref 1.5–6.5)
NEUT%: 58.7 % (ref 38.4–76.8)
Platelets: 104 10*3/uL — ABNORMAL LOW (ref 145–400)
RBC: 4.2 10*6/uL (ref 3.70–5.45)
RDW: 13.4 % (ref 11.2–14.5)
WBC: 5 10*3/uL (ref 3.9–10.3)

## 2013-10-28 ENCOUNTER — Other Ambulatory Visit: Payer: Self-pay | Admitting: *Deleted

## 2013-10-28 DIAGNOSIS — C187 Malignant neoplasm of sigmoid colon: Secondary | ICD-10-CM

## 2013-10-28 MED ORDER — CAPECITABINE 500 MG PO TABS: 1500.0000 mg | ORAL_TABLET | Freq: Two times a day (BID) | ORAL | Status: DC

## 2013-11-01 ENCOUNTER — Other Ambulatory Visit: Payer: Self-pay | Admitting: Oncology

## 2013-11-02 ENCOUNTER — Other Ambulatory Visit (HOSPITAL_BASED_OUTPATIENT_CLINIC_OR_DEPARTMENT_OTHER): Payer: 59

## 2013-11-02 ENCOUNTER — Ambulatory Visit (HOSPITAL_BASED_OUTPATIENT_CLINIC_OR_DEPARTMENT_OTHER): Payer: 59 | Admitting: Nurse Practitioner

## 2013-11-02 ENCOUNTER — Ambulatory Visit: Payer: 59 | Admitting: Nutrition

## 2013-11-02 ENCOUNTER — Ambulatory Visit (HOSPITAL_BASED_OUTPATIENT_CLINIC_OR_DEPARTMENT_OTHER): Payer: 59

## 2013-11-02 VITALS — BP 134/65 | HR 78 | Resp 18

## 2013-11-02 VITALS — BP 140/72 | HR 80 | Temp 98.1°F | Resp 20 | Ht 63.0 in | Wt 134.8 lb

## 2013-11-02 DIAGNOSIS — R110 Nausea: Secondary | ICD-10-CM

## 2013-11-02 DIAGNOSIS — Z5111 Encounter for antineoplastic chemotherapy: Secondary | ICD-10-CM

## 2013-11-02 DIAGNOSIS — C187 Malignant neoplasm of sigmoid colon: Secondary | ICD-10-CM

## 2013-11-02 DIAGNOSIS — R197 Diarrhea, unspecified: Secondary | ICD-10-CM

## 2013-11-02 LAB — CBC WITH DIFFERENTIAL/PLATELET
BASO%: 0.2 % (ref 0.0–2.0)
BASOS ABS: 0 10*3/uL (ref 0.0–0.1)
EOS%: 8.9 % — AB (ref 0.0–7.0)
Eosinophils Absolute: 0.4 10*3/uL (ref 0.0–0.5)
HCT: 35.3 % (ref 34.8–46.6)
HEMOGLOBIN: 12.2 g/dL (ref 11.6–15.9)
LYMPH#: 1.2 10*3/uL (ref 0.9–3.3)
LYMPH%: 29.5 % (ref 14.0–49.7)
MCH: 31 pg (ref 25.1–34.0)
MCHC: 34.6 g/dL (ref 31.5–36.0)
MCV: 89.8 fL (ref 79.5–101.0)
MONO#: 0.5 10*3/uL (ref 0.1–0.9)
MONO%: 12.4 % (ref 0.0–14.0)
NEUT#: 2 10*3/uL (ref 1.5–6.5)
NEUT%: 49 % (ref 38.4–76.8)
NRBC: 0 % (ref 0–0)
Platelets: 183 10*3/uL (ref 145–400)
RBC: 3.93 10*6/uL (ref 3.70–5.45)
RDW: 16.9 % — AB (ref 11.2–14.5)
WBC: 4 10*3/uL (ref 3.9–10.3)

## 2013-11-02 LAB — COMPREHENSIVE METABOLIC PANEL (CC13)
ALBUMIN: 4 g/dL (ref 3.5–5.0)
ALK PHOS: 76 U/L (ref 40–150)
ALT: 16 U/L (ref 0–55)
AST: 16 U/L (ref 5–34)
Anion Gap: 8 mEq/L (ref 3–11)
BILIRUBIN TOTAL: 0.65 mg/dL (ref 0.20–1.20)
BUN: 12.1 mg/dL (ref 7.0–26.0)
CO2: 27 mEq/L (ref 22–29)
Calcium: 9.8 mg/dL (ref 8.4–10.4)
Chloride: 110 mEq/L — ABNORMAL HIGH (ref 98–109)
Creatinine: 0.7 mg/dL (ref 0.6–1.1)
GLUCOSE: 97 mg/dL (ref 70–140)
POTASSIUM: 3.6 meq/L (ref 3.5–5.1)
Sodium: 145 mEq/L (ref 136–145)
TOTAL PROTEIN: 7.2 g/dL (ref 6.4–8.3)

## 2013-11-02 MED ORDER — DEXAMETHASONE SODIUM PHOSPHATE 10 MG/ML IJ SOLN
10.0000 mg | Freq: Once | INTRAMUSCULAR | Status: AC
  Administered 2013-11-02: 10 mg via INTRAVENOUS

## 2013-11-02 MED ORDER — PALONOSETRON HCL INJECTION 0.25 MG/5ML
INTRAVENOUS | Status: AC
  Filled 2013-11-02: qty 5

## 2013-11-02 MED ORDER — DEXTROSE 5 % IV SOLN
Freq: Once | INTRAVENOUS | Status: AC
  Administered 2013-11-02: 12:00:00 via INTRAVENOUS

## 2013-11-02 MED ORDER — PALONOSETRON HCL INJECTION 0.25 MG/5ML
0.2500 mg | Freq: Once | INTRAVENOUS | Status: AC
  Administered 2013-11-02: 0.25 mg via INTRAVENOUS

## 2013-11-02 MED ORDER — DEXAMETHASONE SODIUM PHOSPHATE 10 MG/ML IJ SOLN
INTRAMUSCULAR | Status: AC
  Filled 2013-11-02: qty 1

## 2013-11-02 MED ORDER — SODIUM CHLORIDE 0.9 % IJ SOLN
10.0000 mL | INTRAMUSCULAR | Status: DC | PRN
  Administered 2013-11-02: 10 mL
  Filled 2013-11-02: qty 10

## 2013-11-02 MED ORDER — OXALIPLATIN CHEMO INJECTION 100 MG/20ML
130.0000 mg/m2 | Freq: Once | INTRAVENOUS | Status: AC
  Administered 2013-11-02: 215 mg via INTRAVENOUS
  Filled 2013-11-02: qty 43

## 2013-11-02 MED ORDER — HEPARIN SOD (PORK) LOCK FLUSH 100 UNIT/ML IV SOLN
500.0000 [IU] | Freq: Once | INTRAVENOUS | Status: AC | PRN
  Administered 2013-11-02: 500 [IU]
  Filled 2013-11-02: qty 5

## 2013-11-02 MED ORDER — SODIUM CHLORIDE 0.9 % IV SOLN
150.0000 mg | Freq: Once | INTRAVENOUS | Status: AC
  Administered 2013-11-02: 150 mg via INTRAVENOUS
  Filled 2013-11-02: qty 5

## 2013-11-02 MED ORDER — ONDANSETRON 8 MG PO TBDP: 8.0000 mg | ORAL_TABLET | Freq: Three times a day (TID) | ORAL | Status: DC | PRN

## 2013-11-02 MED ORDER — DEXAMETHASONE 4 MG PO TABS: 8.0000 mg | ORAL_TABLET | Freq: Two times a day (BID) | ORAL | Status: DC

## 2013-11-02 NOTE — Progress Notes (Addendum)
  [place] OFFICE PROGRESS NOTE   Diagnosis:  Colon cancer.  INTERVAL HISTORY:   She returns as scheduled. She completed cycle 2 CAPOX beginning 10/12/2013. Aloxi was added with cycle 2. She again had delayed nausea. No mouth sores. She developed diarrhea during week 2 estimating 4-5 watery stools a day. She took Imodium on one occasion with improvement. The cold sensitivity lasted 9-10 days. No persistent neuropathy symptoms. During week 2 she noted that her feet became "red and sore". There was no skin breakdown. She had similar milder symptoms involving the palms.  Objective:  Vital signs in last 24 hours:  Blood pressure 140/72, pulse 80, temperature 98.1 F (36.7 C), temperature source Oral, resp. rate 20, height $RemoveBe'5\' 3"'rMkHVlezu$  (1.6 m), weight 134 lb 12.8 oz (61.145 kg).    HEENT: No thrush or ulcerations. Resp: Lungs clear. Cardio: Regular cardiac rhythm. GI: Abdomen soft and nontender. No organomegaly. Vascular: No leg edema.  Skin: palms and soles without erythema or skin breakdown. Callus formation at the upper sole bilaterally.     Lab Results:  Lab Results  Component Value Date   WBC 4.0 11/02/2013   HGB 12.2 11/02/2013   HCT 35.3 11/02/2013   MCV 89.8 11/02/2013   PLT 183 11/02/2013   NEUTROABS 2.0 11/02/2013    Imaging:  No results found.  Medications: I have reviewed the patient's current medications.  Assessment/Plan: 1. 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  Cycle 1 adjuvant CAPOX 09/21/2013. Cycle 2 adjuvant CAPOX 10/12/2013. 2. Delayed nausea following cycle 1 CAPOX. Aloxi added with cycle 2. Emend and prophylactic Decadron added with cycle 3.  3. Question early hand-foot syndrome with erythema and discomfort cycle 2 week 2.  4. Diarrhea cycle 2 week 2.     Disposition:she appears stable. She has completed 2 cycles of CAPOX.   She again had delayed nausea following cycle 2. Emend and prophylactic Decadron will be added with cycle 3.   She likely had early hand-foot syndrome during cycle 2. She understands to stop the Xeloda and contact the office if symptoms recur with cycle 3.  The diarrhea she experienced was likely secondary to Xeloda. She will try Imodium. If ineffective she will discontinue Xeloda and contact the office.  She will return for a followup visit and cycle for CAPOX on 11/23/2013.  Patient seen with Dr. Benay Spice. 25 minutes were spent face-to-face at today's visit with the majority that time involved in counseling/coordination of care.   Owens Shark ANP/GNP-BC   11/02/2013  10:12 AM  This was a shared visit with Ned Card. We will adjust the anti-emetic regimen for delayed nausea. She has developed mild hand-foot syndrome secondary to Xeloda. She will contact us for increased foot pain or if the nausea is not better after this cycle.  Julieanne Manson, M.D.

## 2013-11-02 NOTE — Progress Notes (Signed)
Followup completed with patient in chemotherapy.  Patient reports nausea was worse this time than it was the last time.  Medications once again adjusted.  Patient also reports diarrhea.  Patient's weight documented as 134.8 pounds on April 20, down from 140.2 pounds March 4 and 138.6 pounds March 30.  Patient attempted several strategies for increased intake.  She did utilize dietary suggestions for nausea and diarrhea.  Intake improved over the last week.  Nutrition diagnosis: Food and nutrition related knowledge deficit continues.  Intervention: Educated patient on strategies for improving taste of food.  Reinforced nausea prevention.  Encouraged bland, salty foods in small, frequent meals/snacks for nausea.  Also recommended patient continue nausea medication as prescribed.  Discussed foods to improve diarrhea.  Questions were answered.  Teach back method used.  Monitoring, evaluation, goals: Patient will tolerate increased calories and protein after chemotherapy along with improved side effects to minimize weight loss.  Next visit: Monday, May 11, during chemotherapy.

## 2013-11-02 NOTE — Progress Notes (Signed)
Reports tingling sensation both hands and feels like eyes are twitching. Hands covered in warm blanket.  VSS .I do not observe any eye twitching . Per Lattie Haw monitor pt for 30 minutets. Tingling resolved within 20 minutes and discharged in NAD.

## 2013-11-02 NOTE — Patient Instructions (Signed)
[facility] Discharge Instructions for Patients Receiving Chemotherapy  Today you received the following chemotherapy agents Oxaliplatin.  To help prevent nausea and vomiting after your treatment, we encourage you to take your nausea medication.   If you develop nausea and vomiting that is not controlled by your nausea medication, call the clinic.   BELOW ARE SYMPTOMS THAT SHOULD BE REPORTED IMMEDIATELY:  *FEVER GREATER THAN 100.5 F  *CHILLS WITH OR WITHOUT FEVER  NAUSEA AND VOMITING THAT IS NOT CONTROLLED WITH YOUR NAUSEA MEDICATION  *UNUSUAL SHORTNESS OF BREATH  *UNUSUAL BRUISING OR BLEEDING  TENDERNESS IN MOUTH AND THROAT WITH OR WITHOUT PRESENCE OF ULCERS  *URINARY PROBLEMS  *BOWEL PROBLEMS  UNUSUAL RASH Items with * indicate a potential emergency and should be followed up as soon as possible.  Feel free to call the clinic you have any questions or concerns. The clinic phone number is [phone].    

## 2013-11-13 ENCOUNTER — Telehealth: Payer: Self-pay | Admitting: *Deleted

## 2013-11-13 NOTE — Telephone Encounter (Signed)
   Provider input needed: Advice on holding Xeloda   Reason for call: diarrhea & hands/feet red and tender  ALLERGIES:  is allergic to aspirin.  Patient last received chemotherapy/ treatment on 11/02/13- currently on Xeloda-cycle to complete on 11/15/13   Patient was last seen in the office on 11/02/13  Next appt is 11/23/13  Is patient having fevers greater than 100.5?  no   Is patient having uncontrolled pain, or new pain? yes   Is patient having new back pain that changes with position (worsens or eases when laying down?)  no   Is patient able to eat and drink? yesES    Is patient able to pass stool without difficulty?   yes   -diarrhea  Is patient having uncontrolled nausea?  no    patient calls 11/13/2013 with complaint of  Gastrointestinal: positive for diarrhea Integument/breast: positive for reddness, raw, tender hands and feet   Summary Based on the above information advised patient to hold Xeloda and moisturize hands and feet as often as possible. Avoid tight fitting shoes and excess walking. Wear gloves to wash dishes. Push po fluids and use Imodium, taking 1-2 qid as needed. Call if not resolved within 24 hours.  Response provided after speaking with Dr. Benay Spice.   [name]  11/13/2013, 9:16 AM   Background Info  Penny Barnes   DOB: 1960/10/02   MR#: [id number]   CSN#   [id number] 11/13/2013

## 2013-11-18 ENCOUNTER — Other Ambulatory Visit: Payer: Self-pay | Admitting: *Deleted

## 2013-11-18 DIAGNOSIS — C187 Malignant neoplasm of sigmoid colon: Secondary | ICD-10-CM

## 2013-11-18 MED ORDER — CAPECITABINE 500 MG PO TABS: 1500.0000 mg | ORAL_TABLET | Freq: Two times a day (BID) | ORAL | Status: DC

## 2013-11-18 NOTE — Telephone Encounter (Signed)
Faxed request for capecitibine from [facility] given to Dr. Benay Spice Request signed and faxed

## 2013-11-22 ENCOUNTER — Other Ambulatory Visit: Payer: Self-pay | Admitting: Oncology

## 2013-11-23 ENCOUNTER — Other Ambulatory Visit: Payer: Self-pay | Admitting: Nurse Practitioner

## 2013-11-23 ENCOUNTER — Other Ambulatory Visit: Payer: Self-pay | Admitting: *Deleted

## 2013-11-23 ENCOUNTER — Ambulatory Visit (HOSPITAL_BASED_OUTPATIENT_CLINIC_OR_DEPARTMENT_OTHER): Payer: 59

## 2013-11-23 ENCOUNTER — Ambulatory Visit (HOSPITAL_BASED_OUTPATIENT_CLINIC_OR_DEPARTMENT_OTHER): Payer: 59 | Admitting: Oncology

## 2013-11-23 ENCOUNTER — Other Ambulatory Visit (HOSPITAL_BASED_OUTPATIENT_CLINIC_OR_DEPARTMENT_OTHER): Payer: 59

## 2013-11-23 ENCOUNTER — Ambulatory Visit: Payer: 59 | Admitting: Nutrition

## 2013-11-23 ENCOUNTER — Telehealth: Payer: Self-pay | Admitting: *Deleted

## 2013-11-23 VITALS — BP 155/66 | HR 98 | Temp 98.5°F | Resp 18 | Ht 63.0 in | Wt 130.7 lb

## 2013-11-23 DIAGNOSIS — Z5111 Encounter for antineoplastic chemotherapy: Secondary | ICD-10-CM

## 2013-11-23 DIAGNOSIS — D702 Other drug-induced agranulocytosis: Secondary | ICD-10-CM

## 2013-11-23 DIAGNOSIS — L270 Generalized skin eruption due to drugs and medicaments taken internally: Secondary | ICD-10-CM

## 2013-11-23 DIAGNOSIS — C187 Malignant neoplasm of sigmoid colon: Secondary | ICD-10-CM

## 2013-11-23 LAB — CBC WITH DIFFERENTIAL/PLATELET
BASO%: 1 % (ref 0.0–2.0)
Basophils Absolute: 0 10*3/uL (ref 0.0–0.1)
EOS%: 12.8 % — ABNORMAL HIGH (ref 0.0–7.0)
Eosinophils Absolute: 0.4 10*3/uL (ref 0.0–0.5)
HEMATOCRIT: 35.3 % (ref 34.8–46.6)
HGB: 12.1 g/dL (ref 11.6–15.9)
LYMPH%: 29.5 % (ref 14.0–49.7)
MCH: 32.1 pg (ref 25.1–34.0)
MCHC: 34.2 g/dL (ref 31.5–36.0)
MCV: 93.6 fL (ref 79.5–101.0)
MONO#: 0.3 10*3/uL (ref 0.1–0.9)
MONO%: 9.3 % (ref 0.0–14.0)
NEUT#: 1.4 10*3/uL — ABNORMAL LOW (ref 1.5–6.5)
NEUT%: 47.4 % (ref 38.4–76.8)
PLATELETS: 190 10*3/uL (ref 145–400)
RBC: 3.77 10*6/uL (ref 3.70–5.45)
RDW: 19.5 % — ABNORMAL HIGH (ref 11.2–14.5)
WBC: 3 10*3/uL — AB (ref 3.9–10.3)
lymph#: 0.9 10*3/uL (ref 0.9–3.3)

## 2013-11-23 LAB — COMPREHENSIVE METABOLIC PANEL (CC13)
ALK PHOS: 99 U/L (ref 40–150)
ALT: 67 U/L — ABNORMAL HIGH (ref 0–55)
AST: 35 U/L — ABNORMAL HIGH (ref 5–34)
Albumin: 3.6 g/dL (ref 3.5–5.0)
Anion Gap: 10 mEq/L (ref 3–11)
BILIRUBIN TOTAL: 0.52 mg/dL (ref 0.20–1.20)
BUN: 8.8 mg/dL (ref 7.0–26.0)
CO2: 25 meq/L (ref 22–29)
CREATININE: 0.7 mg/dL (ref 0.6–1.1)
Calcium: 9.5 mg/dL (ref 8.4–10.4)
Chloride: 109 mEq/L (ref 98–109)
Glucose: 147 mg/dl — ABNORMAL HIGH (ref 70–140)
Potassium: 3 mEq/L — CL (ref 3.5–5.1)
Sodium: 144 mEq/L (ref 136–145)
Total Protein: 6.6 g/dL (ref 6.4–8.3)

## 2013-11-23 MED ORDER — DEXAMETHASONE SODIUM PHOSPHATE 10 MG/ML IJ SOLN
10.0000 mg | Freq: Once | INTRAMUSCULAR | Status: AC
  Administered 2013-11-23: 10 mg via INTRAVENOUS

## 2013-11-23 MED ORDER — DEXAMETHASONE SODIUM PHOSPHATE 10 MG/ML IJ SOLN
INTRAMUSCULAR | Status: AC
  Filled 2013-11-23: qty 1

## 2013-11-23 MED ORDER — SODIUM CHLORIDE 0.9 % IV SOLN
150.0000 mg | Freq: Once | INTRAVENOUS | Status: AC
  Administered 2013-11-23: 150 mg via INTRAVENOUS
  Filled 2013-11-23: qty 5

## 2013-11-23 MED ORDER — SODIUM CHLORIDE 0.9 % IJ SOLN
10.0000 mL | INTRAMUSCULAR | Status: DC | PRN
  Administered 2013-11-23: 10 mL
  Filled 2013-11-23: qty 10

## 2013-11-23 MED ORDER — SODIUM CHLORIDE 0.9 % IV SOLN
Freq: Once | INTRAVENOUS | Status: AC
  Administered 2013-11-23: 10:00:00 via INTRAVENOUS

## 2013-11-23 MED ORDER — OXALIPLATIN CHEMO INJECTION 100 MG/20ML
130.0000 mg/m2 | Freq: Once | INTRAVENOUS | Status: AC
  Administered 2013-11-23: 215 mg via INTRAVENOUS
  Filled 2013-11-23: qty 43

## 2013-11-23 MED ORDER — POTASSIUM CHLORIDE CRYS ER 20 MEQ PO TBCR
EXTENDED_RELEASE_TABLET | ORAL | Status: DC
Start: 2013-11-23 — End: 2013-12-14

## 2013-11-23 MED ORDER — PALONOSETRON HCL INJECTION 0.25 MG/5ML
INTRAVENOUS | Status: AC
  Filled 2013-11-23: qty 5

## 2013-11-23 MED ORDER — DEXTROSE 5 % IV SOLN
Freq: Once | INTRAVENOUS | Status: AC
  Administered 2013-11-23: 11:00:00 via INTRAVENOUS

## 2013-11-23 MED ORDER — PALONOSETRON HCL INJECTION 0.25 MG/5ML
0.2500 mg | Freq: Once | INTRAVENOUS | Status: AC
  Administered 2013-11-23: 0.25 mg via INTRAVENOUS

## 2013-11-23 MED ORDER — HEPARIN SOD (PORK) LOCK FLUSH 100 UNIT/ML IV SOLN
500.0000 [IU] | Freq: Once | INTRAVENOUS | Status: AC | PRN
  Administered 2013-11-23: 500 [IU]
  Filled 2013-11-23: qty 5

## 2013-11-23 NOTE — Progress Notes (Signed)
Patient reports nausea was improved after last round of chemotherapy.  She attributes this to steroids and her nausea medication.  Patient is complaining of increased cramping and diarrhea.  Weight continues to decline and was documented as 130 pounds down from 134.8 pounds April 20.  She reports diarrhea has improved as of yesterday.  Nutrition diagnosis: Food and nutrition related knowledge deficit continues.  Intervention: Reinforced nausea prevention.  Encouraged small, frequent meals and snacks throughout the day including bland, salty foods as tolerated for improved oral intake.  Education provided on strategies for improving diarrhea through diet.  Recommended low fiber foods, as well as foods to include to thicken stools.  Provided fact sheets.  Questions were answered.  Teach back method used.  Monitoring, evaluation, goals: Patient will tolerate increased calories and protein for weight maintenance.  Next visit: Will continue to follow patient during chemotherapy.  Patient will call me with questions if needed before then.

## 2013-11-23 NOTE — Progress Notes (Signed)
Tolerated chemo well today. Reported sensation of needing to clear her throat and hands felt numb toward end of infusion.

## 2013-11-23 NOTE — Progress Notes (Signed)
Treat today with ANC 1.4, per Dr. Benay Spice.

## 2013-11-23 NOTE — Patient Instructions (Signed)
[facility] Discharge Instructions for Patients Receiving Chemotherapy  Today you received the following chemotherapy agent: Oxaliplatin (Xeloda at home)  To help prevent nausea and vomiting after your treatment, we encourage you to take your nausea medications as directed:  Decadron 8 mg twice daily x 2 days (start tomorrow)  Ativan 0.5 mg every 8 hours as needed for nausea or sleep  Compazine 10 mg every 6 hours as needed  If you develop nausea and vomiting that is not controlled by your nausea medication, call the clinic.   Begin Potassium chloride 20 meq--take one twice today, then daily till further notice  BELOW ARE SYMPTOMS THAT SHOULD BE REPORTED IMMEDIATELY:  *FEVER GREATER THAN 100.5 F  *CHILLS WITH OR WITHOUT FEVER  NAUSEA AND VOMITING THAT IS NOT CONTROLLED WITH YOUR NAUSEA MEDICATION  *UNUSUAL SHORTNESS OF BREATH  *UNUSUAL BRUISING OR BLEEDING  TENDERNESS IN MOUTH AND THROAT WITH OR WITHOUT PRESENCE OF ULCERS  *URINARY PROBLEMS  *BOWEL PROBLEMS  UNUSUAL RASH Items with * indicate a potential emergency and should be followed up as soon as possible.  Feel free to call the clinic should you have any questions or concerns. The clinic phone number is [phone].

## 2013-11-23 NOTE — Telephone Encounter (Signed)
Message from pt reporting pharmacy told her there was no steroid prescription there for her. Confirmed with pharmacy pt had 3 refills on Decadron, requested they call pt when ready for pick up. Pt made aware, voiced appreciation for call.

## 2013-11-23 NOTE — Progress Notes (Signed)
  [place] OFFICE PROGRESS NOTE   Diagnosis: Colon cancer`  INTERVAL HISTORY:   She completed another cycle of CAPOX beginning 11/02/2013. She reports the nausea was much better with the prophylactic Decadron. She has developed hyperpigmentation over the hands and feet, no pain. Cold sensitivity lasted 1 one half weeks following chemotherapy. This has resolved. No neuropathy symptoms today.  Your the second week of Xeloda she developed diarrhea and the Xeloda was discontinued 3 days early. She continued to have diarrhea approximately 3-4 times each evening until 11/21/2013. No diarrhea over the past 24 hours. She also reports several episodes of rectal bleeding mixed with the diarrhea.  Objective:  Vital signs in last 24 hours:  Blood pressure 155/66, pulse 98, temperature 98.5 F (36.9 C), temperature source Oral, resp. rate 18, height $RemoveBe'5\' 3"'dlIMkFVxX$  (1.6 m), weight 130 lb 11.2 oz (59.285 kg), SpO2 100.00%.    HEENT: No thrush or ulcers Resp: Lungs clear bilaterally Cardio: Regular rate and rhythm GI: No hepatomegaly, soft, nontender Vascular: No leg edema Neuro: The vibratory sense is intact at the fingertips  Skin: Mild hyperpigmentation at the hands and feet, no skin breakdown, callous formation at the soles   Portacath/PICC-without erythema  Lab Results:  Lab Results  Component Value Date   WBC 3.0* 11/23/2013   HGB 12.1 11/23/2013   HCT 35.3 11/23/2013   MCV 93.6 11/23/2013   PLT 190 11/23/2013   NEUTROABS 1.4* 11/23/2013    Potassium 3.0    Medications: I have reviewed the patient's current medications.  Assessment/Plan: 1. 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  Cycle 1 adjuvant CAPOX 09/21/2013.  Cycle 2 adjuvant CAPOX 10/12/2013. Cycle 3 adjuvant CAPOX  11/03/1998 2. Delayed nausea following cycle 1 CAPOX. Aloxi added with cycle 2. Emend and prophylactic Decadron added with cycle 3-improved 3. Early hand-foot syndrome secondary to Xeloda  4. Diarrhea cycle 3 week 2. We will dose reduce the Xeloda beginning with cycle 4 5. Neutropenia second chemotherapy-she will contact us for a fever and will return for a nadir CBC   Disposition:  She has completed 4 cycles of adjuvant therapy. The Xeloda will be dose reduced with this cycle. She will contact us for a fever. She'll return for a nadir CBC in approximately 10 days. Ms. Malachy Moan reports mild rectal bleeding. This is most likely related to hemorrhoids or enteritis. She will contact us for increased bleeding and we will make a GI referral.  Ladell Pier, MD  11/23/2013  9:18 AM

## 2013-11-23 NOTE — Telephone Encounter (Signed)
I have scheduled treatment appt

## 2013-11-23 NOTE — Progress Notes (Signed)
Met with patient during infusion to assess for needs.  She stated she had diarrhea over the past week.  She stated she had been eating a lot of raw fruit.  She met with the dietician today and received education on how to eat.  She states she is doing well otherwise.  She denies any barriers to care at present.

## 2013-12-03 ENCOUNTER — Other Ambulatory Visit (HOSPITAL_BASED_OUTPATIENT_CLINIC_OR_DEPARTMENT_OTHER): Payer: 59

## 2013-12-03 DIAGNOSIS — C187 Malignant neoplasm of sigmoid colon: Secondary | ICD-10-CM

## 2013-12-03 LAB — CBC WITH DIFFERENTIAL/PLATELET
BASO%: 0.5 % (ref 0.0–2.0)
Basophils Absolute: 0 10*3/uL (ref 0.0–0.1)
EOS ABS: 0.1 10*3/uL (ref 0.0–0.5)
EOS%: 3.6 % (ref 0.0–7.0)
HCT: 36.9 % (ref 34.8–46.6)
HGB: 12.4 g/dL (ref 11.6–15.9)
LYMPH%: 24.3 % (ref 14.0–49.7)
MCH: 31.8 pg (ref 25.1–34.0)
MCHC: 33.6 g/dL (ref 31.5–36.0)
MCV: 94.6 fL (ref 79.5–101.0)
MONO#: 0.5 10*3/uL (ref 0.1–0.9)
MONO%: 11.7 % (ref 0.0–14.0)
NEUT%: 59.9 % (ref 38.4–76.8)
NEUTROS ABS: 2.4 10*3/uL (ref 1.5–6.5)
PLATELETS: 109 10*3/uL — AB (ref 145–400)
RBC: 3.9 10*6/uL (ref 3.70–5.45)
RDW: 19 % — ABNORMAL HIGH (ref 11.2–14.5)
WBC: 4.1 10*3/uL (ref 3.9–10.3)
lymph#: 1 10*3/uL (ref 0.9–3.3)

## 2013-12-07 ENCOUNTER — Other Ambulatory Visit: Payer: Self-pay | Admitting: Oncology

## 2013-12-08 ENCOUNTER — Telehealth: Payer: Self-pay | Admitting: *Deleted

## 2013-12-08 NOTE — Telephone Encounter (Signed)
[facility]  faxed Capcitabine refill request.  Request to provider's desk/in-basket for review.

## 2013-12-09 ENCOUNTER — Other Ambulatory Visit: Payer: Self-pay | Admitting: *Deleted

## 2013-12-09 DIAGNOSIS — C187 Malignant neoplasm of sigmoid colon: Secondary | ICD-10-CM

## 2013-12-09 MED ORDER — CAPECITABINE 500 MG PO TABS: ORAL_TABLET | ORAL | Status: DC

## 2013-12-09 NOTE — Telephone Encounter (Signed)
Called pt to follow up on how she tolerated last Xeloda cycle. She reports improvement in appearance of palmar/ plantar surfaces. Denies pain. Stated she felt " a lot better" with this round of chemo. The diarrhea has also improved. Pt confirms she took Xeloda 1,500 mg QAM and 1,000 mg QPM for 14 days. OK to refill Xeloda at reduced dose, per Dr. Benay Spice. Rx faxed to [facility].

## 2013-12-09 NOTE — Telephone Encounter (Signed)
[facility] called requesting an order for Capecitabine.  Informed [facility] is working on this.

## 2013-12-14 ENCOUNTER — Ambulatory Visit (HOSPITAL_BASED_OUTPATIENT_CLINIC_OR_DEPARTMENT_OTHER): Payer: 59

## 2013-12-14 ENCOUNTER — Other Ambulatory Visit: Payer: Self-pay | Admitting: Oncology

## 2013-12-14 ENCOUNTER — Other Ambulatory Visit (HOSPITAL_BASED_OUTPATIENT_CLINIC_OR_DEPARTMENT_OTHER): Payer: 59

## 2013-12-14 ENCOUNTER — Ambulatory Visit (HOSPITAL_BASED_OUTPATIENT_CLINIC_OR_DEPARTMENT_OTHER): Payer: 59 | Admitting: Nurse Practitioner

## 2013-12-14 ENCOUNTER — Telehealth: Payer: Self-pay | Admitting: Oncology

## 2013-12-14 VITALS — BP 138/64 | HR 70 | Temp 98.5°F | Resp 18 | Ht 63.0 in | Wt 129.4 lb

## 2013-12-14 DIAGNOSIS — C187 Malignant neoplasm of sigmoid colon: Secondary | ICD-10-CM

## 2013-12-14 DIAGNOSIS — Z5111 Encounter for antineoplastic chemotherapy: Secondary | ICD-10-CM

## 2013-12-14 DIAGNOSIS — G620 Drug-induced polyneuropathy: Secondary | ICD-10-CM

## 2013-12-14 DIAGNOSIS — L270 Generalized skin eruption due to drugs and medicaments taken internally: Secondary | ICD-10-CM

## 2013-12-14 LAB — CBC WITH DIFFERENTIAL/PLATELET
BASO%: 0.6 % (ref 0.0–2.0)
Basophils Absolute: 0 10*3/uL (ref 0.0–0.1)
EOS%: 6.8 % (ref 0.0–7.0)
Eosinophils Absolute: 0.2 10*3/uL (ref 0.0–0.5)
HCT: 36.9 % (ref 34.8–46.6)
HGB: 12.5 g/dL (ref 11.6–15.9)
LYMPH%: 31.5 % (ref 14.0–49.7)
MCH: 32.4 pg (ref 25.1–34.0)
MCHC: 33.9 g/dL (ref 31.5–36.0)
MCV: 95.6 fL (ref 79.5–101.0)
MONO#: 0.4 10*3/uL (ref 0.1–0.9)
MONO%: 11.7 % (ref 0.0–14.0)
NEUT%: 49.4 % (ref 38.4–76.8)
NEUTROS ABS: 1.5 10*3/uL (ref 1.5–6.5)
PLATELETS: 144 10*3/uL — AB (ref 145–400)
RBC: 3.86 10*6/uL (ref 3.70–5.45)
RDW: 19.6 % — ABNORMAL HIGH (ref 11.2–14.5)
WBC: 3.1 10*3/uL — ABNORMAL LOW (ref 3.9–10.3)
lymph#: 1 10*3/uL (ref 0.9–3.3)

## 2013-12-14 LAB — COMPREHENSIVE METABOLIC PANEL (CC13)
ALT: 102 U/L — ABNORMAL HIGH (ref 0–55)
AST: 66 U/L — ABNORMAL HIGH (ref 5–34)
Albumin: 4 g/dL (ref 3.5–5.0)
Alkaline Phosphatase: 97 U/L (ref 40–150)
Anion Gap: 13 mEq/L — ABNORMAL HIGH (ref 3–11)
BILIRUBIN TOTAL: 0.57 mg/dL (ref 0.20–1.20)
BUN: 15.2 mg/dL (ref 7.0–26.0)
CALCIUM: 9.5 mg/dL (ref 8.4–10.4)
CHLORIDE: 108 meq/L (ref 98–109)
CO2: 22 meq/L (ref 22–29)
CREATININE: 0.7 mg/dL (ref 0.6–1.1)
GLUCOSE: 88 mg/dL (ref 70–140)
Potassium: 3.6 mEq/L (ref 3.5–5.1)
Sodium: 143 mEq/L (ref 136–145)
Total Protein: 6.9 g/dL (ref 6.4–8.3)

## 2013-12-14 MED ORDER — DEXAMETHASONE SODIUM PHOSPHATE 10 MG/ML IJ SOLN
10.0000 mg | Freq: Once | INTRAMUSCULAR | Status: AC
  Administered 2013-12-14: 10 mg via INTRAVENOUS

## 2013-12-14 MED ORDER — POTASSIUM CHLORIDE CRYS ER 20 MEQ PO TBCR: 20.0000 meq | EXTENDED_RELEASE_TABLET | Freq: Every day | ORAL | Status: DC

## 2013-12-14 MED ORDER — SODIUM CHLORIDE 0.9 % IJ SOLN
10.0000 mL | INTRAMUSCULAR | Status: DC | PRN
  Administered 2013-12-14: 10 mL
  Filled 2013-12-14: qty 10

## 2013-12-14 MED ORDER — SODIUM CHLORIDE 0.9 % IV SOLN
150.0000 mg | Freq: Once | INTRAVENOUS | Status: AC
  Administered 2013-12-14: 150 mg via INTRAVENOUS
  Filled 2013-12-14: qty 5

## 2013-12-14 MED ORDER — DEXAMETHASONE SODIUM PHOSPHATE 10 MG/ML IJ SOLN
INTRAMUSCULAR | Status: AC
  Filled 2013-12-14: qty 1

## 2013-12-14 MED ORDER — DEXTROSE 5 % IV SOLN
Freq: Once | INTRAVENOUS | Status: AC
  Administered 2013-12-14: 12:00:00 via INTRAVENOUS

## 2013-12-14 MED ORDER — LORAZEPAM 0.5 MG PO TABS: ORAL_TABLET | ORAL | Status: DC

## 2013-12-14 MED ORDER — DEXTROSE 5 % IV SOLN
100.0000 mg/m2 | Freq: Once | INTRAVENOUS | Status: AC
  Administered 2013-12-14: 165 mg via INTRAVENOUS
  Filled 2013-12-14: qty 33

## 2013-12-14 MED ORDER — PALONOSETRON HCL INJECTION 0.25 MG/5ML
INTRAVENOUS | Status: AC
  Filled 2013-12-14: qty 5

## 2013-12-14 MED ORDER — HEPARIN SOD (PORK) LOCK FLUSH 100 UNIT/ML IV SOLN
500.0000 [IU] | Freq: Once | INTRAVENOUS | Status: AC | PRN
  Administered 2013-12-14: 500 [IU]
  Filled 2013-12-14: qty 5

## 2013-12-14 MED ORDER — PALONOSETRON HCL INJECTION 0.25 MG/5ML
0.2500 mg | Freq: Once | INTRAVENOUS | Status: AC
  Administered 2013-12-14: 0.25 mg via INTRAVENOUS

## 2013-12-14 NOTE — Telephone Encounter (Signed)
PT GIVEN SCHEDULE FOR JUNE/JULY WHILE IN INFUSION.

## 2013-12-14 NOTE — Patient Instructions (Signed)
[facility] Discharge Instructions for Patients Receiving Chemotherapy  Today you received the following chemotherapy agents: Oxaliplatin.   To help prevent nausea and vomiting after your treatment, we encourage you to take your nausea medication, Compazine. Take one every six hours as needed.  For nausea on or after 6/4, use Zofran every eight hours as needed.   If you develop nausea and vomiting that is not controlled by your nausea medication, call the clinic.   BELOW ARE SYMPTOMS THAT SHOULD BE REPORTED IMMEDIATELY:  *FEVER GREATER THAN 100.5 F  *CHILLS WITH OR WITHOUT FEVER  NAUSEA AND VOMITING THAT IS NOT CONTROLLED WITH YOUR NAUSEA MEDICATION  *UNUSUAL SHORTNESS OF BREATH  *UNUSUAL BRUISING OR BLEEDING  TENDERNESS IN MOUTH AND THROAT WITH OR WITHOUT PRESENCE OF ULCERS  *URINARY PROBLEMS  *BOWEL PROBLEMS  UNUSUAL RASH Items with * indicate a potential emergency and should be followed up as soon as possible.  Feel free to call the clinic should you have any questions or concerns. The clinic phone number is [phone].

## 2013-12-14 NOTE — Progress Notes (Signed)
  [facility] OFFICE PROGRESS NOTE   Diagnosis:  Colon cancer.  INTERVAL HISTORY:   Penny Barnes returns as scheduled. She completed cycle 4 CAPOX beginning 11/23/2013. No significant nausea/vomiting. No mouth sores. She has occasional loose stools. Since adjusting her diet she has noted improvement in bowel habits. She has persistent mild cold sensitivity in the hands. No numbness or tingling in the absence of cold exposure. No hand or foot pain or redness. The skin over the balls of both feet has peeled.  Objective:  Vital signs in last 24 hours:  Blood pressure 138/64, pulse 70, temperature 98.5 F (36.9 C), temperature source Oral, resp. rate 18, height $RemoveBe'5\' 3"'GabruVpyg$  (1.6 m), weight 129 lb 6.4 oz (58.695 kg), SpO2 100.00%.    HEENT: No thrush or ulcerations. Resp: Lungs clear. Cardio: Regular cardiac rhythm. GI: Abdomen soft and nontender. No hepatomegaly. Vascular: No leg edema. Neuro: Vibratory sense mildly decreased over the fingertips per tuning fork exam.  Skin: Palms and soles without erythema; mild hyperpigmentation. Callus formation at the soles.  Port-A-Cath site without erythema.   Lab Results:  Lab Results  Component Value Date   WBC 3.1* 12/14/2013   HGB 12.5 12/14/2013   HCT 36.9 12/14/2013   MCV 95.6 12/14/2013   PLT 144* 12/14/2013   NEUTROABS 1.5 12/14/2013    Imaging:  No results found.  Medications: I have reviewed the patient's current medications.  Assessment/Plan: 1. 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  Cycle 1 adjuvant CAPOX 09/21/2013.  Cycle 2 adjuvant CAPOX 10/12/2013.  Cycle 3 adjuvant CAPOX 11/02/2013. Cycle 4 adjuvant CAPOX 11/23/2013. 2. Delayed nausea following cycle 1 CAPOX. Aloxi added with cycle 2. Emend and prophylactic Decadron added with  cycle 3-improved 3. Early hand-foot syndrome secondary to Xeloda.  4. Diarrhea cycle 3 week 2. Xeloda dose reduced beginning with cycle 4. 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 Mild decrease in vibratory sense 12/14/2013. 6. History of neutropenia secondary to chemotherapy.   Disposition: She appears stable. She has completed 4 cycles of adjuvant CAPOX. Plan to proceed with cycle 5 today as scheduled. The oxaliplatin will be dose reduced to 100 mg per meter squared beginning with cycle 5 due to progressive neuropathy symptoms.  She will return for a followup visit and cycle 6 in 3 weeks. She will contact the office in the interim with any problems.  Plan reviewed with Dr. Benay Spice.   Owens Shark ANP/GNP-BC   12/14/2013  11:47 AM

## 2013-12-17 ENCOUNTER — Telehealth: Payer: Self-pay | Admitting: *Deleted

## 2013-12-17 NOTE — Telephone Encounter (Signed)
Call from pt reporting "dimpling" of the skin over her port a cath. Returned call, determined pt is feeling the nodules on her power port. Assured her this is normal, will schedule visit for nurse eval if she'd like. She declined, stated she will call office if anything changes or she developed pain.

## 2013-12-30 ENCOUNTER — Other Ambulatory Visit: Payer: Self-pay | Admitting: *Deleted

## 2013-12-30 DIAGNOSIS — C187 Malignant neoplasm of sigmoid colon: Secondary | ICD-10-CM

## 2013-12-30 MED ORDER — CAPECITABINE 500 MG PO TABS: ORAL_TABLET | ORAL | Status: DC

## 2014-01-03 ENCOUNTER — Other Ambulatory Visit: Payer: Self-pay | Admitting: Oncology

## 2014-01-04 ENCOUNTER — Ambulatory Visit: Payer: 59 | Admitting: Nutrition

## 2014-01-04 ENCOUNTER — Ambulatory Visit: Payer: 59

## 2014-01-04 ENCOUNTER — Ambulatory Visit (HOSPITAL_BASED_OUTPATIENT_CLINIC_OR_DEPARTMENT_OTHER): Payer: 59 | Admitting: Nurse Practitioner

## 2014-01-04 ENCOUNTER — Telehealth: Payer: Self-pay | Admitting: Oncology

## 2014-01-04 ENCOUNTER — Other Ambulatory Visit (HOSPITAL_BASED_OUTPATIENT_CLINIC_OR_DEPARTMENT_OTHER): Payer: 59

## 2014-01-04 ENCOUNTER — Ambulatory Visit (HOSPITAL_BASED_OUTPATIENT_CLINIC_OR_DEPARTMENT_OTHER): Payer: 59

## 2014-01-04 VITALS — BP 145/73 | HR 73 | Temp 98.7°F | Resp 18 | Ht 63.0 in | Wt 132.9 lb

## 2014-01-04 DIAGNOSIS — C187 Malignant neoplasm of sigmoid colon: Secondary | ICD-10-CM

## 2014-01-04 DIAGNOSIS — L270 Generalized skin eruption due to drugs and medicaments taken internally: Secondary | ICD-10-CM

## 2014-01-04 DIAGNOSIS — C779 Secondary and unspecified malignant neoplasm of lymph node, unspecified: Secondary | ICD-10-CM

## 2014-01-04 DIAGNOSIS — R110 Nausea: Secondary | ICD-10-CM

## 2014-01-04 DIAGNOSIS — R197 Diarrhea, unspecified: Secondary | ICD-10-CM

## 2014-01-04 DIAGNOSIS — Z5111 Encounter for antineoplastic chemotherapy: Secondary | ICD-10-CM

## 2014-01-04 LAB — CBC WITH DIFFERENTIAL/PLATELET
BASO%: 1.3 % (ref 0.0–2.0)
Basophils Absolute: 0 10*3/uL (ref 0.0–0.1)
EOS%: 3.4 % (ref 0.0–7.0)
Eosinophils Absolute: 0.1 10*3/uL (ref 0.0–0.5)
HEMATOCRIT: 38.1 % (ref 34.8–46.6)
HGB: 12.6 g/dL (ref 11.6–15.9)
LYMPH%: 28.8 % (ref 14.0–49.7)
MCH: 33.6 pg (ref 25.1–34.0)
MCHC: 33 g/dL (ref 31.5–36.0)
MCV: 101.6 fL — ABNORMAL HIGH (ref 79.5–101.0)
MONO#: 0.5 10*3/uL (ref 0.1–0.9)
MONO%: 16.9 % — ABNORMAL HIGH (ref 0.0–14.0)
NEUT#: 1.5 10*3/uL (ref 1.5–6.5)
NEUT%: 49.6 % (ref 38.4–76.8)
PLATELETS: 150 10*3/uL (ref 145–400)
RBC: 3.75 10*6/uL (ref 3.70–5.45)
RDW: 19.6 % — ABNORMAL HIGH (ref 11.2–14.5)
WBC: 2.9 10*3/uL — AB (ref 3.9–10.3)
lymph#: 0.8 10*3/uL — ABNORMAL LOW (ref 0.9–3.3)

## 2014-01-04 LAB — COMPREHENSIVE METABOLIC PANEL (CC13)
ALK PHOS: 107 U/L (ref 40–150)
ALT: 107 U/L — AB (ref 0–55)
AST: 71 U/L — AB (ref 5–34)
Albumin: 3.9 g/dL (ref 3.5–5.0)
Anion Gap: 6 mEq/L (ref 3–11)
BUN: 10.7 mg/dL (ref 7.0–26.0)
CO2: 28 mEq/L (ref 22–29)
Calcium: 9.4 mg/dL (ref 8.4–10.4)
Chloride: 109 mEq/L (ref 98–109)
Creatinine: 0.7 mg/dL (ref 0.6–1.1)
Glucose: 92 mg/dl (ref 70–140)
POTASSIUM: 3.8 meq/L (ref 3.5–5.1)
SODIUM: 142 meq/L (ref 136–145)
TOTAL PROTEIN: 6.8 g/dL (ref 6.4–8.3)
Total Bilirubin: 0.52 mg/dL (ref 0.20–1.20)

## 2014-01-04 MED ORDER — DEXTROSE 5 % IV SOLN
Freq: Once | INTRAVENOUS | Status: AC
  Administered 2014-01-04: 13:00:00 via INTRAVENOUS

## 2014-01-04 MED ORDER — HEPARIN SOD (PORK) LOCK FLUSH 100 UNIT/ML IV SOLN
500.0000 [IU] | Freq: Once | INTRAVENOUS | Status: AC | PRN
  Administered 2014-01-04: 500 [IU]
  Filled 2014-01-04: qty 5

## 2014-01-04 MED ORDER — OXALIPLATIN CHEMO INJECTION 100 MG/20ML
100.0000 mg/m2 | Freq: Once | INTRAVENOUS | Status: AC
  Administered 2014-01-04: 165 mg via INTRAVENOUS
  Filled 2014-01-04: qty 33

## 2014-01-04 MED ORDER — DEXAMETHASONE SODIUM PHOSPHATE 10 MG/ML IJ SOLN
10.0000 mg | Freq: Once | INTRAMUSCULAR | Status: AC
  Administered 2014-01-04: 10 mg via INTRAVENOUS

## 2014-01-04 MED ORDER — PALONOSETRON HCL INJECTION 0.25 MG/5ML
0.2500 mg | Freq: Once | INTRAVENOUS | Status: AC
  Administered 2014-01-04: 0.25 mg via INTRAVENOUS

## 2014-01-04 MED ORDER — SODIUM CHLORIDE 0.9 % IV SOLN
150.0000 mg | Freq: Once | INTRAVENOUS | Status: AC
  Administered 2014-01-04: 150 mg via INTRAVENOUS
  Filled 2014-01-04: qty 5

## 2014-01-04 MED ORDER — DEXAMETHASONE SODIUM PHOSPHATE 10 MG/ML IJ SOLN
INTRAMUSCULAR | Status: AC
  Filled 2014-01-04: qty 1

## 2014-01-04 MED ORDER — PALONOSETRON HCL INJECTION 0.25 MG/5ML
INTRAVENOUS | Status: AC
  Filled 2014-01-04: qty 5

## 2014-01-04 MED ORDER — SODIUM CHLORIDE 0.9 % IJ SOLN
10.0000 mL | INTRAMUSCULAR | Status: DC | PRN
  Administered 2014-01-04: 10 mL
  Filled 2014-01-04: qty 10

## 2014-01-04 NOTE — Patient Instructions (Signed)
[facility] Discharge Instructions for Patients Receiving Chemotherapy  Today you received the following chemotherapy agents: Oxaliplatin  To help prevent nausea and vomiting after your treatment, we encourage you to take your nausea medication as prescribed.    If you develop nausea and vomiting that is not controlled by your nausea medication, call the clinic.   BELOW ARE SYMPTOMS THAT SHOULD BE REPORTED IMMEDIATELY:  *FEVER GREATER THAN 100.5 F  *CHILLS WITH OR WITHOUT FEVER  NAUSEA AND VOMITING THAT IS NOT CONTROLLED WITH YOUR NAUSEA MEDICATION  *UNUSUAL SHORTNESS OF BREATH  *UNUSUAL BRUISING OR BLEEDING  TENDERNESS IN MOUTH AND THROAT WITH OR WITHOUT PRESENCE OF ULCERS  *URINARY PROBLEMS  *BOWEL PROBLEMS  UNUSUAL RASH Items with * indicate a potential emergency and should be followed up as soon as possible.  Feel free to call the clinic you have any questions or concerns. The clinic phone number is [phone].    

## 2014-01-04 NOTE — Progress Notes (Signed)
Patient reports she is feeling well.  Weight documented as 132 pounds.  Patient reports her appetite has been good, especially on steroids.  She will have completed 6 out of 8 treatments after today.  Diarrhea has improved.  Nutrition diagnosis: Food and nutrition related knowledge deficit improved.  Intervention: Encouraged patient to continue same strategies for continued adequate intake.  Questions were answered.  Teach back method used.  Monitoring, evaluation, goals: Patient is tolerating increased calories and protein.  Has gained 2 pounds.  Visit: Monday, July 13 in chemotherapy.

## 2014-01-04 NOTE — Progress Notes (Signed)
  [place] OFFICE PROGRESS NOTE   Diagnosis: Colon cancer.  INTERVAL HISTORY:   Penny Barnes returns as scheduled. She completed cycle 5 CAPOX beginning 12/14/2013. She denies nausea/vomiting. No mouth sores. She has occasional loose stools. No hand or foot redness. No hand pain. She notes the heels are "sensitive". No skin breakdown. She has occasional numbness in the feet. This morning she noted mild numbness/tingling in the last 2 fingers on both hands. This does not interfere with activity. She reports a good appetite and weight gain.  Objective:  Vital signs in last 24 hours:  Blood pressure 145/73, pulse 73, temperature 98.7 F (37.1 C), temperature source Oral, resp. rate 18, height _0  (1.6 m), weight 132 lb 14.4 oz (60.283 kg).    HEENT: No thrush or ulcerations. Resp: Lungs clear. Cardio: Regular cardiac rhythm. GI: Abdomen soft and nontender. No hepatomegaly. Vascular: No leg edema. Neuro: Mild decrease in vibratory sense over the fingertips per tuning fork exam.  Skin: Palms and soles without erythema. Mild hyperpigmentation. No skin breakdown.  Port-A-Cath site without erythema.    Lab Results:  Lab Results  Component Value Date   WBC 2.9* 01/04/2014   HGB 12.6 01/04/2014   HCT 38.1 01/04/2014   MCV 101.6* 01/04/2014   PLT 150 01/04/2014   NEUTROABS 1.5 01/04/2014    Imaging:  No results found.  Medications: I have reviewed the patient's current medications.  Assessment/Plan: 1. 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  Cycle 1 adjuvant CAPOX 09/21/2013.  Cycle 2 adjuvant CAPOX 10/12/2013.  Cycle 3 adjuvant CAPOX 11/02/2013.  Cycle 4 adjuvant CAPOX 11/23/2013. Cycle 5 adjuvant CAPOX 12/14/2013. 2. Delayed nausea following cycle 1 CAPOX. Aloxi added  with cycle 2. Emend and prophylactic Decadron added with cycle 3-improved 3. Early hand-foot syndrome secondary to Xeloda.  4. Diarrhea cycle 3 week 2. Xeloda dose reduced beginning with cycle 4. 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 Mild decrease in vibratory sense 12/14/2013. Oxaliplatin dose reduced beginning with cycle 5. 6. History of neutropenia secondary to chemotherapy.   Disposition: She appears stable. She has completed 5 cycles of adjuvant CAPOX. Plan to proceed with cycle 6 today as scheduled. She will return for a followup visit and cycle 7 in 3 weeks. She will contact the office in the interim with any problems.    Ned Card ANP/GNP-BC   01/04/2014  12:29 PM

## 2014-01-04 NOTE — Telephone Encounter (Signed)
gv and printed appt sched and avs for pt fro July adn Aug ....sed added tx. °

## 2014-01-20 ENCOUNTER — Other Ambulatory Visit: Payer: Self-pay | Admitting: Nurse Practitioner

## 2014-01-20 DIAGNOSIS — C187 Malignant neoplasm of sigmoid colon: Secondary | ICD-10-CM

## 2014-01-24 ENCOUNTER — Other Ambulatory Visit: Payer: Self-pay | Admitting: Oncology

## 2014-01-25 ENCOUNTER — Ambulatory Visit: Payer: 59 | Admitting: Nutrition

## 2014-01-25 ENCOUNTER — Other Ambulatory Visit (HOSPITAL_BASED_OUTPATIENT_CLINIC_OR_DEPARTMENT_OTHER): Payer: 59

## 2014-01-25 ENCOUNTER — Ambulatory Visit (HOSPITAL_BASED_OUTPATIENT_CLINIC_OR_DEPARTMENT_OTHER): Payer: 59

## 2014-01-25 ENCOUNTER — Other Ambulatory Visit: Payer: Self-pay | Admitting: *Deleted

## 2014-01-25 ENCOUNTER — Ambulatory Visit (HOSPITAL_BASED_OUTPATIENT_CLINIC_OR_DEPARTMENT_OTHER): Payer: 59 | Admitting: Oncology

## 2014-01-25 VITALS — BP 153/64 | HR 79 | Temp 97.3°F | Resp 18 | Ht 63.0 in | Wt 134.3 lb

## 2014-01-25 DIAGNOSIS — C187 Malignant neoplasm of sigmoid colon: Secondary | ICD-10-CM

## 2014-01-25 DIAGNOSIS — Z5111 Encounter for antineoplastic chemotherapy: Secondary | ICD-10-CM

## 2014-01-25 DIAGNOSIS — L270 Generalized skin eruption due to drugs and medicaments taken internally: Secondary | ICD-10-CM

## 2014-01-25 DIAGNOSIS — G620 Drug-induced polyneuropathy: Secondary | ICD-10-CM

## 2014-01-25 LAB — CBC WITH DIFFERENTIAL/PLATELET
BASO%: 1 % (ref 0.0–2.0)
Basophils Absolute: 0 10*3/uL (ref 0.0–0.1)
EOS ABS: 0.1 10*3/uL (ref 0.0–0.5)
EOS%: 3.6 % (ref 0.0–7.0)
HCT: 38.4 % (ref 34.8–46.6)
HGB: 12.7 g/dL (ref 11.6–15.9)
LYMPH%: 26.8 % (ref 14.0–49.7)
MCH: 34.4 pg — AB (ref 25.1–34.0)
MCHC: 33.1 g/dL (ref 31.5–36.0)
MCV: 103.8 fL — AB (ref 79.5–101.0)
MONO#: 0.4 10*3/uL (ref 0.1–0.9)
MONO%: 14.2 % — AB (ref 0.0–14.0)
NEUT#: 1.4 10*3/uL — ABNORMAL LOW (ref 1.5–6.5)
NEUT%: 54.4 % (ref 38.4–76.8)
PLATELETS: 151 10*3/uL (ref 145–400)
RBC: 3.7 10*6/uL (ref 3.70–5.45)
RDW: 17.5 % — AB (ref 11.2–14.5)
WBC: 2.6 10*3/uL — ABNORMAL LOW (ref 3.9–10.3)
lymph#: 0.7 10*3/uL — ABNORMAL LOW (ref 0.9–3.3)

## 2014-01-25 LAB — COMPREHENSIVE METABOLIC PANEL (CC13)
ALT: 121 U/L — ABNORMAL HIGH (ref 0–55)
AST: 76 U/L — AB (ref 5–34)
Albumin: 3.7 g/dL (ref 3.5–5.0)
Alkaline Phosphatase: 109 U/L (ref 40–150)
Anion Gap: 8 mEq/L (ref 3–11)
BILIRUBIN TOTAL: 0.5 mg/dL (ref 0.20–1.20)
BUN: 15 mg/dL (ref 7.0–26.0)
CO2: 26 mEq/L (ref 22–29)
Calcium: 9.4 mg/dL (ref 8.4–10.4)
Chloride: 109 mEq/L (ref 98–109)
Creatinine: 0.7 mg/dL (ref 0.6–1.1)
GLUCOSE: 95 mg/dL (ref 70–140)
Potassium: 3.9 mEq/L (ref 3.5–5.1)
Sodium: 143 mEq/L (ref 136–145)
Total Protein: 6.6 g/dL (ref 6.4–8.3)

## 2014-01-25 MED ORDER — HEPARIN SOD (PORK) LOCK FLUSH 100 UNIT/ML IV SOLN
500.0000 [IU] | Freq: Once | INTRAVENOUS | Status: AC | PRN
  Administered 2014-01-25: 500 [IU]
  Filled 2014-01-25: qty 5

## 2014-01-25 MED ORDER — CAPECITABINE 500 MG PO TABS: ORAL_TABLET | ORAL | Status: DC

## 2014-01-25 MED ORDER — DEXTROSE 5 % IV SOLN
100.0000 mg/m2 | Freq: Once | INTRAVENOUS | Status: AC
  Administered 2014-01-25: 165 mg via INTRAVENOUS
  Filled 2014-01-25: qty 33

## 2014-01-25 MED ORDER — DEXAMETHASONE SODIUM PHOSPHATE 10 MG/ML IJ SOLN
10.0000 mg | Freq: Once | INTRAMUSCULAR | Status: AC
  Administered 2014-01-25: 10 mg via INTRAVENOUS

## 2014-01-25 MED ORDER — PALONOSETRON HCL INJECTION 0.25 MG/5ML
INTRAVENOUS | Status: AC
  Filled 2014-01-25: qty 5

## 2014-01-25 MED ORDER — DEXAMETHASONE SODIUM PHOSPHATE 10 MG/ML IJ SOLN
INTRAMUSCULAR | Status: AC
  Filled 2014-01-25: qty 1

## 2014-01-25 MED ORDER — PALONOSETRON HCL INJECTION 0.25 MG/5ML
0.2500 mg | Freq: Once | INTRAVENOUS | Status: AC
  Administered 2014-01-25: 0.25 mg via INTRAVENOUS

## 2014-01-25 MED ORDER — DEXTROSE 5 % IV SOLN
Freq: Once | INTRAVENOUS | Status: AC
  Administered 2014-01-25: 12:00:00 via INTRAVENOUS

## 2014-01-25 MED ORDER — FOSAPREPITANT DIMEGLUMINE INJECTION 150 MG
150.0000 mg | Freq: Once | INTRAVENOUS | Status: AC
  Administered 2014-01-25: 150 mg via INTRAVENOUS
  Filled 2014-01-25: qty 5

## 2014-01-25 MED ORDER — SODIUM CHLORIDE 0.9 % IJ SOLN
10.0000 mL | INTRAMUSCULAR | Status: DC | PRN
  Administered 2014-01-25: 10 mL
  Filled 2014-01-25: qty 10

## 2014-01-25 NOTE — Progress Notes (Signed)
  [place] OFFICE PROGRESS NOTE   Diagnosis: Colon cancer  INTERVAL HISTORY:   [name] returns as scheduled. She completed another cycle of CAPOX beginning 01/04/2014. No mouth sores, nausea, or diarrhea. She reports mild tingling and numbness in the fingers and feet. This does not interfere with activity.  Objective:  Vital signs in last 24 hours:  Blood pressure 153/64, pulse 79, temperature 97.3 F (36.3 C), temperature source Oral, resp. rate 18, height $RemoveBe'5\' 3"'xXuyqONRS$  (1.6 m), weight 134 lb 4.8 oz (60.918 kg), SpO2 100.00%.    HEENT: No thrush or ulcer Resp: Lungs clear bilaterally Cardio: Regular in rhythm GI: No hepatomegaly Vascular: No leg edema Neuro: mild decrease in vibratory sense at the fingertips bilaterally  Skin: Mild hyperpigmentation at the palms and soles. No erythema or skin breakdown.   Portacath/PICC-without erythema  Lab Results:  Lab Results  Component Value Date   WBC 2.6* 01/25/2014   HGB 12.7 01/25/2014   HCT 38.4 01/25/2014   MCV 103.8* 01/25/2014   PLT 151 01/25/2014   NEUTROABS 1.4* 01/25/2014    Medications: I have reviewed the patient's current medications.  Assessment/Plan: 1. 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  Cycle 1 adjuvant CAPOX 09/21/2013.  Cycle 2 adjuvant CAPOX 10/12/2013.  Cycle 3 adjuvant CAPOX 11/02/2013.  Cycle 4 adjuvant CAPOX 11/23/2013.  Cycle 5 adjuvant CAPOX 12/14/2013. Cycle 6 adjuvant CAPOX 01/04/2014 2. Delayed nausea following cycle 1 CAPOX. Aloxi added with cycle 2. Emend and prophylactic Decadron added with cycle 3-improved 3. Early hand-foot syndrome secondary to Xeloda.  4. Diarrhea cycle 3 week 2. Xeloda dose reduced beginning with cycle 4. 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  Mild decrease in vibratory sense 12/14/2013. Oxaliplatin dose reduced beginning with cycle 5. 6. History of neutropenia secondary to chemotherapy   Disposition:  She appears stable. The mild oxaliplatin neuropathy symptoms have not changed. The plan is to proceed with cycle 7 CAPOX today.  The neutrophil count is borderline low. She was treated at this level with cycle 4. A nadir CBC following cycle 4 family ANC of 2.4. The plan is to proceed with chemotherapy today. She knows to contact us for a fever or symptoms of infection.  Penny Barnes will return problems visit and chemotherapy in 3 weeks.  Betsy Coder, MD  01/25/2014  9:49 AM

## 2014-01-25 NOTE — Patient Instructions (Signed)
[facility] Discharge Instructions for Patients Receiving Chemotherapy  Today you received the following chemotherapy agents Oxaliplatin.  To help prevent nausea and vomiting after your treatment, we encourage you to take your nausea medication.   If you develop nausea and vomiting that is not controlled by your nausea medication, call the clinic.   BELOW ARE SYMPTOMS THAT SHOULD BE REPORTED IMMEDIATELY:  *FEVER GREATER THAN 100.5 F  *CHILLS WITH OR WITHOUT FEVER  NAUSEA AND VOMITING THAT IS NOT CONTROLLED WITH YOUR NAUSEA MEDICATION  *UNUSUAL SHORTNESS OF BREATH  *UNUSUAL BRUISING OR BLEEDING  TENDERNESS IN MOUTH AND THROAT WITH OR WITHOUT PRESENCE OF ULCERS  *URINARY PROBLEMS  *BOWEL PROBLEMS  UNUSUAL RASH Items with * indicate a potential emergency and should be followed up as soon as possible.  Feel free to call the clinic you have any questions or concerns. The clinic phone number is [phone].    

## 2014-01-25 NOTE — Progress Notes (Signed)
Followup completed with patient and husband, during chemotherapy.  Patient states she is doing well.  Weight increased to 134.3 pounds on July 13 from 132 pounds.  Appetite continues to be improved and patient able to eat well after treatment has completed.  Patient struggles with some constipation in the first 5 days after treatment.  Nutrition diagnosis: Food and nutrition related knowledge deficit improved.  Intervention: Patient educated to continue strategies for adequate oral intake for weight maintenance.  Recommended patient speak with physician regarding constipation.  Additional questions were answered.  Teach back method used.  Monitoring, evaluation, goals: Patient will continue strategies for increased oral intake to promote weight gain or stabilization.   Next visit: Monday, August 3 during chemotherapy.    **Disclaimer: This note was dictated with voice recognition software. Similar sounding words can inadvertently be transcribed and this note may contain transcription errors which may not have been corrected upon publication of note.**

## 2014-02-11 ENCOUNTER — Ambulatory Visit (INDEPENDENT_AMBULATORY_CARE_PROVIDER_SITE_OTHER): Payer: 59 | Admitting: General Surgery

## 2014-02-11 ENCOUNTER — Encounter (INDEPENDENT_AMBULATORY_CARE_PROVIDER_SITE_OTHER): Payer: Self-pay | Admitting: General Surgery

## 2014-02-11 DIAGNOSIS — C187 Malignant neoplasm of sigmoid colon: Secondary | ICD-10-CM

## 2014-02-11 NOTE — Progress Notes (Signed)
Chief complaint: Followup cancer sigmoid colon  History: The patient returns for followup History of laparoscopic sigmoid colectomy for what proved to be a T3 N1 invasive adenocarcinoma of the sigmoid colon, surgery date February 2015.  She is just about to complete her course of adjuvant chemotherapy with CAPOX.  She has had some expected side effects with GI issues and some neuropathy that has improved. Overall tolerated well. She has no other GI symptoms or abdominal pain or complaints currently.  Past Medical History  Diagnosis Date  . Vitiligo   . Colon cancer   . Complication of anesthesia   . PONV (postoperative nausea and vomiting)    Past Surgical History  Procedure Laterality Date  . Abdominal hysterectomy      have one ovary  . Laparoscopic sigmoid colectomy N/A 08/25/2013    Procedure: LAPAROSCOPIC SIGMOID COLECTOMY;  Surgeon: Edward Jolly, MD;  Location: [facility];  Service: General;  Laterality: N/A;  . Colon surgery    . Portacath placement Left 09/18/2013    Procedure: INSERTION PORT-A-CATH;  Surgeon: Edward Jolly, MD;  Location: [place];  Service: General;  Laterality: Left;   Current Outpatient Prescriptions  Medication Sig Dispense Refill  . capecitabine (XELODA) 500 MG tablet Take 3 tablets (1,500 mg) PO QAM, 2 tablets (1,000 mg) QPM Take for 14 days on, then 7 day rest.  70 tablet  0  . dexamethasone (DECADRON) 4 MG tablet TAKE 2 TABLETS BY MOUTH TWICE DAILY BEGINNING THE DAY AFTER CHEMO  8 tablet  1  . lidocaine-prilocaine (EMLA) cream Apply 1 application topically as needed.  30 g  0  . LORazepam (ATIVAN) 0.5 MG tablet Take 1 table (0.5mg ) by mouth every 8 (eight) hours as needed for anxiety, nausea, or sleep  30 tablet  1  . potassium chloride SA (K-DUR,KLOR-CON) 20 MEQ tablet Take 1 tablet (20 mEq total) by mouth daily.  30 tablet  2   No current facility-administered medications for this visit.   Exam: There were no vitals taken for this  visit. General: Appears well Skin: No rash or infection Lymph nodes: No cervical, subclavicular or inguinal nodes palpable Abdomen: Soft and nontender. Incisions well healed without hernias. No masses. No organomegaly. Port-A-Cath site looks fine  Assessment and plan: Doing well following the above treatment without complication or evidence of early recurrence. I will plan to see her back in 6 months. Dr. Benay Spice is planning lab work and scans toward the end of the year.

## 2014-02-14 ENCOUNTER — Other Ambulatory Visit: Payer: Self-pay | Admitting: Oncology

## 2014-02-15 ENCOUNTER — Other Ambulatory Visit (HOSPITAL_BASED_OUTPATIENT_CLINIC_OR_DEPARTMENT_OTHER): Payer: 59

## 2014-02-15 ENCOUNTER — Other Ambulatory Visit: Payer: Self-pay | Admitting: *Deleted

## 2014-02-15 ENCOUNTER — Telehealth: Payer: Self-pay | Admitting: Oncology

## 2014-02-15 ENCOUNTER — Ambulatory Visit (HOSPITAL_BASED_OUTPATIENT_CLINIC_OR_DEPARTMENT_OTHER): Payer: 59

## 2014-02-15 ENCOUNTER — Ambulatory Visit (HOSPITAL_BASED_OUTPATIENT_CLINIC_OR_DEPARTMENT_OTHER): Payer: 59 | Admitting: Oncology

## 2014-02-15 ENCOUNTER — Ambulatory Visit: Payer: 59 | Admitting: Nutrition

## 2014-02-15 VITALS — BP 147/63 | HR 82 | Temp 98.5°F | Resp 18 | Ht 63.0 in | Wt 136.3 lb

## 2014-02-15 DIAGNOSIS — C779 Secondary and unspecified malignant neoplasm of lymph node, unspecified: Secondary | ICD-10-CM

## 2014-02-15 DIAGNOSIS — C187 Malignant neoplasm of sigmoid colon: Secondary | ICD-10-CM

## 2014-02-15 DIAGNOSIS — R110 Nausea: Secondary | ICD-10-CM

## 2014-02-15 DIAGNOSIS — Z5111 Encounter for antineoplastic chemotherapy: Secondary | ICD-10-CM

## 2014-02-15 DIAGNOSIS — R197 Diarrhea, unspecified: Secondary | ICD-10-CM

## 2014-02-15 DIAGNOSIS — T451X5A Adverse effect of antineoplastic and immunosuppressive drugs, initial encounter: Secondary | ICD-10-CM

## 2014-02-15 DIAGNOSIS — L270 Generalized skin eruption due to drugs and medicaments taken internally: Secondary | ICD-10-CM

## 2014-02-15 DIAGNOSIS — G622 Polyneuropathy due to other toxic agents: Secondary | ICD-10-CM

## 2014-02-15 LAB — COMPREHENSIVE METABOLIC PANEL (CC13)
ALT: 73 U/L — AB (ref 0–55)
ANION GAP: 9 meq/L (ref 3–11)
AST: 51 U/L — ABNORMAL HIGH (ref 5–34)
Albumin: 3.8 g/dL (ref 3.5–5.0)
Alkaline Phosphatase: 107 U/L (ref 40–150)
BUN: 15 mg/dL (ref 7.0–26.0)
CALCIUM: 9.5 mg/dL (ref 8.4–10.4)
CO2: 27 mEq/L (ref 22–29)
Chloride: 106 mEq/L (ref 98–109)
Creatinine: 0.7 mg/dL (ref 0.6–1.1)
Glucose: 106 mg/dl (ref 70–140)
POTASSIUM: 4.3 meq/L (ref 3.5–5.1)
Sodium: 142 mEq/L (ref 136–145)
TOTAL PROTEIN: 6.8 g/dL (ref 6.4–8.3)
Total Bilirubin: 0.42 mg/dL (ref 0.20–1.20)

## 2014-02-15 LAB — CBC WITH DIFFERENTIAL/PLATELET
BASO%: 0.9 % (ref 0.0–2.0)
Basophils Absolute: 0 10*3/uL (ref 0.0–0.1)
EOS%: 3.2 % (ref 0.0–7.0)
Eosinophils Absolute: 0.1 10*3/uL (ref 0.0–0.5)
HCT: 39.6 % (ref 34.8–46.6)
HGB: 13.2 g/dL (ref 11.6–15.9)
LYMPH#: 0.7 10*3/uL — AB (ref 0.9–3.3)
LYMPH%: 27.6 % (ref 14.0–49.7)
MCH: 34.7 pg — ABNORMAL HIGH (ref 25.1–34.0)
MCHC: 33.2 g/dL (ref 31.5–36.0)
MCV: 104.6 fL — ABNORMAL HIGH (ref 79.5–101.0)
MONO#: 0.4 10*3/uL (ref 0.1–0.9)
MONO%: 14.5 % — ABNORMAL HIGH (ref 0.0–14.0)
NEUT#: 1.4 10*3/uL — ABNORMAL LOW (ref 1.5–6.5)
NEUT%: 53.8 % (ref 38.4–76.8)
Platelets: 163 10*3/uL (ref 145–400)
RBC: 3.79 10*6/uL (ref 3.70–5.45)
RDW: 16.7 % — ABNORMAL HIGH (ref 11.2–14.5)
WBC: 2.5 10*3/uL — AB (ref 3.9–10.3)

## 2014-02-15 LAB — CEA: CEA: 1.7 ng/mL (ref 0.0–5.0)

## 2014-02-15 MED ORDER — PALONOSETRON HCL INJECTION 0.25 MG/5ML
INTRAVENOUS | Status: AC
  Filled 2014-02-15: qty 5

## 2014-02-15 MED ORDER — OXALIPLATIN CHEMO INJECTION 100 MG/20ML
100.0000 mg/m2 | Freq: Once | INTRAVENOUS | Status: AC
  Administered 2014-02-15: 165 mg via INTRAVENOUS
  Filled 2014-02-15: qty 33

## 2014-02-15 MED ORDER — SODIUM CHLORIDE 0.9 % IV SOLN
150.0000 mg | Freq: Once | INTRAVENOUS | Status: AC
  Administered 2014-02-15: 150 mg via INTRAVENOUS
  Filled 2014-02-15: qty 5

## 2014-02-15 MED ORDER — DEXAMETHASONE SODIUM PHOSPHATE 10 MG/ML IJ SOLN
INTRAMUSCULAR | Status: AC
  Filled 2014-02-15: qty 1

## 2014-02-15 MED ORDER — DEXTROSE 5 % IV SOLN
Freq: Once | INTRAVENOUS | Status: AC
  Administered 2014-02-15: 11:00:00 via INTRAVENOUS

## 2014-02-15 MED ORDER — DEXAMETHASONE SODIUM PHOSPHATE 10 MG/ML IJ SOLN
10.0000 mg | Freq: Once | INTRAMUSCULAR | Status: AC
  Administered 2014-02-15: 10 mg via INTRAVENOUS

## 2014-02-15 MED ORDER — SODIUM CHLORIDE 0.9 % IJ SOLN
10.0000 mL | INTRAMUSCULAR | Status: DC | PRN
  Administered 2014-02-15: 10 mL
  Filled 2014-02-15: qty 10

## 2014-02-15 MED ORDER — HEPARIN SOD (PORK) LOCK FLUSH 100 UNIT/ML IV SOLN
500.0000 [IU] | Freq: Once | INTRAVENOUS | Status: AC | PRN
  Administered 2014-02-15: 500 [IU]
  Filled 2014-02-15: qty 5

## 2014-02-15 MED ORDER — CAPECITABINE 500 MG PO TABS: ORAL_TABLET | ORAL | Status: DC

## 2014-02-15 MED ORDER — PALONOSETRON HCL INJECTION 0.25 MG/5ML
0.2500 mg | Freq: Once | INTRAVENOUS | Status: AC
  Administered 2014-02-15: 0.25 mg via INTRAVENOUS

## 2014-02-15 NOTE — Telephone Encounter (Signed)
Pt confirmed labs/flush/ov per 08/03 POF, gave pt AVS..Marland KitchenKJ

## 2014-02-15 NOTE — Patient Instructions (Signed)
[facility] Discharge Instructions for Patients Receiving Chemotherapy  Today you received the following chemotherapy agents: Oxaliplatin  To help prevent nausea and vomiting after your treatment, we encourage you to take your nausea medication as prescribed.    If you develop nausea and vomiting that is not controlled by your nausea medication, call the clinic.   BELOW ARE SYMPTOMS THAT SHOULD BE REPORTED IMMEDIATELY:  *FEVER GREATER THAN 100.5 F  *CHILLS WITH OR WITHOUT FEVER  NAUSEA AND VOMITING THAT IS NOT CONTROLLED WITH YOUR NAUSEA MEDICATION  *UNUSUAL SHORTNESS OF BREATH  *UNUSUAL BRUISING OR BLEEDING  TENDERNESS IN MOUTH AND THROAT WITH OR WITHOUT PRESENCE OF ULCERS  *URINARY PROBLEMS  *BOWEL PROBLEMS  UNUSUAL RASH Items with * indicate a potential emergency and should be followed up as soon as possible.  Feel free to call the clinic you have any questions or concerns. The clinic phone number is [phone].    

## 2014-02-15 NOTE — Progress Notes (Signed)
  [place] OFFICE PROGRESS NOTE   Diagnosis: Colon cancer  INTERVAL HISTORY:   Penny Barnes returns as scheduled. She completed another cycle of CAPOX beginning 01/25/2014. She reports mild diarrhea at the end of the first week of chemotherapy. No nausea or mouth sores. Mild numbness in the fourth and fifth fingers and toes bilaterally. Cold sensitivity lasted for one and one half weeks following the oxaliplatin. The numbness has not interfere with activity.  Objective:  Vital signs in last 24 hours:  There were no vitals taken for this visit.    HEENT: Small ecchymosis at the right buccal mucosa, no thrush or ulcers Resp: Lungs clear bilaterally Cardio: Regular rate and rhythm GI: No hepatomegaly, nontender Vascular: No leg edema Neuro: Mild decrease in vibratory sense at the fingertips bilaterally  Skin: Mild hyperpigmentation at the palms. Skin thickening and mild callus formation at the soles.   Portacath/PICC-without erythema  Lab Results:  Lab Results  Component Value Date   WBC 2.6* 01/25/2014   HGB 12.7 01/25/2014   HCT 38.4 01/25/2014   MCV 103.8* 01/25/2014   PLT 151 01/25/2014   NEUTROABS 1.4* 01/25/2014    Medications: I have reviewed the patient's current medications.  Assessment/Plan: 1. 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  Cycle 1 adjuvant CAPOX 09/21/2013.  Cycle 2 adjuvant CAPOX 10/12/2013.  Cycle 3 adjuvant CAPOX 11/02/2013.  Cycle 4 adjuvant CAPOX 11/23/2013.  Cycle 5 adjuvant CAPOX 12/14/2013.  Cycle 6 adjuvant CAPOX 01/04/2014 Cycle 7 adjuvant CAPOX 01/25/2014 2. Delayed nausea following cycle 1 CAPOX. Aloxi added with cycle 2. Emend and prophylactic Decadron added with cycle 3-improved 3. Early hand-foot syndrome secondary to Xeloda.   4. Diarrhea cycle 3 week 2. Xeloda dose reduced beginning with cycle 4. 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 Mild decrease in vibratory sense. Oxaliplatin dose reduced beginning with cycle 5. 6. History of neutropenia secondary to chemotherapy-stable   Disposition: She continues to tolerate the chemotherapy well. The neuropathy symptoms are unchanged. The plan is to proceed with cycle 8 of adjuvant CAPOX today. She will return for a CBC in 3 weeks and an office visit in 6 weeks. We will refer her for removal of the Port-A-Cath if she appears stable when we see her in 6 weeks. She knows to contact us for a fever or symptoms of an infection.  Betsy Coder, MD  02/15/2014  8:40 AM

## 2014-02-15 NOTE — Progress Notes (Signed)
Per Dr.Sherril ok to treat with decreased ANC of 1.4.

## 2014-02-15 NOTE — Progress Notes (Signed)
Patient receiving her final chemotherapy today.  Patient reports she feels well.  Weight increased to 136.3 pounds on August 3.  Appetite has improved.  No nutrition concerns.  Nutrition diagnosis: Food and nutrition related knowledge deficit resolved.  Encouraged patient to contact me she develops any nutrition issues.  She has demonstrated good knowledge of appropriate diet after chemotherapy.  She has my contact information for questions.  **Disclaimer: This note was dictated with voice recognition software. Similar sounding words can inadvertently be transcribed and this note may contain transcription errors which may not have been corrected upon publication of note.**

## 2014-02-15 NOTE — Progress Notes (Signed)
Met with patient.  She is very excited about today as it is her last treatment.  She denies problems or needs.  She understands that at her next follow-up visit she will be given a survivorship care plan to use as a guide for follow-up.  She was appreciative of the visit.

## 2014-02-15 NOTE — Telephone Encounter (Signed)
Message from pt with Xeloda pill count. She reports she has 10 day supply on hand. Needs 4 day supply faxed to pharmacy to complete final cycle. Same done.

## 2014-03-08 ENCOUNTER — Other Ambulatory Visit (HOSPITAL_BASED_OUTPATIENT_CLINIC_OR_DEPARTMENT_OTHER): Payer: 59

## 2014-03-08 ENCOUNTER — Telehealth: Payer: Self-pay | Admitting: Oncology

## 2014-03-08 ENCOUNTER — Ambulatory Visit (HOSPITAL_BASED_OUTPATIENT_CLINIC_OR_DEPARTMENT_OTHER): Payer: 59 | Admitting: Nurse Practitioner

## 2014-03-08 VITALS — BP 134/61 | HR 87 | Temp 97.9°F | Resp 20 | Ht 63.0 in | Wt 136.6 lb

## 2014-03-08 DIAGNOSIS — G620 Drug-induced polyneuropathy: Secondary | ICD-10-CM

## 2014-03-08 DIAGNOSIS — C187 Malignant neoplasm of sigmoid colon: Secondary | ICD-10-CM

## 2014-03-08 LAB — CBC WITH DIFFERENTIAL/PLATELET
BASO%: 1 % (ref 0.0–2.0)
BASOS ABS: 0 10*3/uL (ref 0.0–0.1)
EOS ABS: 0.1 10*3/uL (ref 0.0–0.5)
EOS%: 3.6 % (ref 0.0–7.0)
HEMATOCRIT: 39.4 % (ref 34.8–46.6)
HGB: 12.9 g/dL (ref 11.6–15.9)
LYMPH#: 0.8 10*3/uL — AB (ref 0.9–3.3)
LYMPH%: 29.4 % (ref 14.0–49.7)
MCH: 34.1 pg — ABNORMAL HIGH (ref 25.1–34.0)
MCHC: 32.7 g/dL (ref 31.5–36.0)
MCV: 104.4 fL — ABNORMAL HIGH (ref 79.5–101.0)
MONO#: 0.4 10*3/uL (ref 0.1–0.9)
MONO%: 14.1 % — ABNORMAL HIGH (ref 0.0–14.0)
NEUT%: 51.9 % (ref 38.4–76.8)
NEUTROS ABS: 1.4 10*3/uL — AB (ref 1.5–6.5)
Platelets: 139 10*3/uL — ABNORMAL LOW (ref 145–400)
RBC: 3.78 10*6/uL (ref 3.70–5.45)
RDW: 16.5 % — ABNORMAL HIGH (ref 11.2–14.5)
WBC: 2.8 10*3/uL — AB (ref 3.9–10.3)

## 2014-03-08 NOTE — Progress Notes (Signed)
Met with patient during clinic visit.  She has completed treatment.  Other than c/o mild neuropathy in hands and feet, she is doing well.  She denies needs and expresses appreciation for her care.  She was given information on upcoming GI support group.

## 2014-03-08 NOTE — Telephone Encounter (Signed)
Pt confirmed labs/ov per 08/24, contrast given and spk w/Sonya for referral, waiting to hear back to sch and can't sch w/GI no sch's that far out, will leave in workqueue., gave pt AVS....KJ

## 2014-03-08 NOTE — Progress Notes (Addendum)
  [place] OFFICE PROGRESS NOTE   Diagnosis:  Colon cancer.  INTERVAL HISTORY:   Penny Barnes returns as scheduled. She completed the eighth and final cycle of CAPOX beginning 02/15/2014. She denies nausea/vomiting. She had a single sore on her tongue. She has intermittent loose stools. No significant diarrhea. She has tingling in the fingertips and toes. The tingling does not interfere with function. She notes improvement in the tingling when she is active. No abdominal pain. She has a good appetite.  She reports her father recently died at the age of 40 following a hip/leg fracture.  Objective:  Vital signs in last 24 hours:  Blood pressure 134/61, pulse 87, temperature 97.9 F (36.6 C), temperature source Oral, resp. rate 20, height _0  (1.6 m), weight 136 lb 9.6 oz (61.961 kg), SpO2 100.00%.    HEENT: No thrush or ulcers. Resp: Lungs clear bilaterally. Cardio: Regular rate and rhythm. GI: Abdomen soft and nontender. No hepatomegaly. Vascular: No leg edema. Neuro: Mild decrease in vibratory sense over the right fingertips; vibratory sense intact over the left fingertips.  Skin: Palms without erythema.  Port-A-Cath site without erythema.    Lab Results:  Lab Results  Component Value Date   WBC 2.8* 03/08/2014   HGB 12.9 03/08/2014   HCT 39.4 03/08/2014   MCV 104.4* 03/08/2014   PLT 139* 03/08/2014   NEUTROABS 1.4* 03/08/2014    Imaging:  No results found.  Medications: I have reviewed the patient's current medications.  Assessment/Plan: 1. 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  Cycle 1 adjuvant CAPOX 09/21/2013.  Cycle 2 adjuvant CAPOX 10/12/2013.  Cycle 3 adjuvant CAPOX 11/02/2013.  Cycle 4 adjuvant CAPOX 11/23/2013.  Cycle 5 adjuvant CAPOX 12/14/2013.    Cycle 6 adjuvant CAPOX 01/04/2014  Cycle 7 adjuvant CAPOX 01/25/2014. Cycle 8 adjuvant CAPOX 02/15/2014. 02/15/2014 CEA 1.7. 2. History of delayed nausea following cycle 1 CAPOX. Aloxi added with cycle 2. Emend and prophylactic Decadron added with cycle 3. 3. History of hand-foot syndrome secondary to Xeloda.  4. Diarrhea cycle 3 week 2. Xeloda dose reduced beginning with cycle 4. 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 Mild decrease in vibratory sense. Oxaliplatin dose reduced beginning with cycle 5. 6. History of neutropenia secondary to chemotherapy.   Disposition: Penny Barnes appears stable. She completed the eighth and final cycle of adjuvant CAPOX beginning 02/15/2014.   We made a referral to Dr. Excell Seltzer for Port-A-Cath removal. We made a referral to Dr. Fuller Plan to schedule a one-year colonoscopy in January 2016.  She will be scheduled for labs to include a CBC, chemistry panel and CEA and CT scans of the chest, abdomen and pelvis in January 2016. She will return for a followup visit approximately 1 week later to review the results.  She will contact the office prior to her next visit with any problems.  Patient seen with Dr. Benay Spice.   Ned Card ANP/GNP-BC   03/08/2014  11:27 AM   This was a shared visit with Ned Card. Penny Barnes has completed adjuvant therapy. She will return for an office visit and restaging CT in 6 months. Hopefully the neuropathy symptoms will improve over the next few months.  Julieanne Manson, M.D.

## 2014-03-29 ENCOUNTER — Other Ambulatory Visit: Payer: 59

## 2014-06-08 ENCOUNTER — Telehealth: Payer: Self-pay | Admitting: Oncology

## 2014-06-08 ENCOUNTER — Encounter: Payer: Self-pay | Admitting: Gastroenterology

## 2014-06-08 NOTE — Telephone Encounter (Signed)
Pt called to r/s MD visit due to Colonoscopy apt is 08/05/14, pt confirmed MD visit, mailed updated sch per pt's request... KJ

## 2014-07-22 ENCOUNTER — Ambulatory Visit (AMBULATORY_SURGERY_CENTER): Payer: Self-pay | Admitting: *Deleted

## 2014-07-22 VITALS — Ht 63.0 in | Wt 146.0 lb

## 2014-07-22 DIAGNOSIS — Z85038 Personal history of other malignant neoplasm of large intestine: Secondary | ICD-10-CM

## 2014-07-22 MED ORDER — MOVIPREP 100 G PO SOLR: 1.0000 | Freq: Once | ORAL | Status: DC

## 2014-07-22 NOTE — Progress Notes (Signed)
No egg or soy allergy. No anesthesia problems.  No home O2.  No diet meds.  

## 2014-07-26 ENCOUNTER — Ambulatory Visit (HOSPITAL_COMMUNITY)
Admission: RE | Admit: 2014-07-26 | Discharge: 2014-07-26 | Disposition: A | Discharge status: Home / Self Care | Payer: 59 | Source: Ambulatory Visit | Attending: Nurse Practitioner | Admitting: Nurse Practitioner

## 2014-07-26 ENCOUNTER — Encounter (HOSPITAL_COMMUNITY): Payer: Self-pay

## 2014-07-26 ENCOUNTER — Other Ambulatory Visit (HOSPITAL_BASED_OUTPATIENT_CLINIC_OR_DEPARTMENT_OTHER): Payer: 59

## 2014-07-26 DIAGNOSIS — Z9221 Personal history of antineoplastic chemotherapy: Secondary | ICD-10-CM | POA: Insufficient documentation

## 2014-07-26 DIAGNOSIS — Z9049 Acquired absence of other specified parts of digestive tract: Secondary | ICD-10-CM | POA: Diagnosis not present

## 2014-07-26 DIAGNOSIS — C187 Malignant neoplasm of sigmoid colon: Secondary | ICD-10-CM

## 2014-07-26 DIAGNOSIS — Z08 Encounter for follow-up examination after completed treatment for malignant neoplasm: Secondary | ICD-10-CM | POA: Insufficient documentation

## 2014-07-26 DIAGNOSIS — C189 Malignant neoplasm of colon, unspecified: Secondary | ICD-10-CM | POA: Insufficient documentation

## 2014-07-26 LAB — COMPREHENSIVE METABOLIC PANEL (CC13)
ALT: 22 U/L (ref 0–55)
ANION GAP: 9 meq/L (ref 3–11)
AST: 22 U/L (ref 5–34)
Albumin: 4.3 g/dL (ref 3.5–5.0)
Alkaline Phosphatase: 98 U/L (ref 40–150)
BILIRUBIN TOTAL: 0.45 mg/dL (ref 0.20–1.20)
BUN: 18.7 mg/dL (ref 7.0–26.0)
CHLORIDE: 107 meq/L (ref 98–109)
CO2: 27 meq/L (ref 22–29)
Calcium: 9.6 mg/dL (ref 8.4–10.4)
Creatinine: 0.8 mg/dL (ref 0.6–1.1)
EGFR: 88 mL/min/{1.73_m2} — AB (ref >90)
GLUCOSE: 111 mg/dL (ref 70–140)
POTASSIUM: 4.4 meq/L (ref 3.5–5.1)
SODIUM: 143 meq/L (ref 136–145)
Total Protein: 7.8 g/dL (ref 6.4–8.3)

## 2014-07-26 LAB — CBC WITH DIFFERENTIAL/PLATELET
BASO%: 0.7 % (ref 0.0–2.0)
BASOS ABS: 0 10*3/uL (ref 0.0–0.1)
EOS ABS: 0.1 10*3/uL (ref 0.0–0.5)
EOS%: 3.3 % (ref 0.0–7.0)
HCT: 41.6 % (ref 34.8–46.6)
HGB: 13.9 g/dL (ref 11.6–15.9)
LYMPH%: 32.1 % (ref 14.0–49.7)
MCH: 29.8 pg (ref 25.1–34.0)
MCHC: 33.4 g/dL (ref 31.5–36.0)
MCV: 89.1 fL (ref 79.5–101.0)
MONO#: 0.3 10*3/uL (ref 0.1–0.9)
MONO%: 8.7 % (ref 0.0–14.0)
NEUT%: 55.2 % (ref 38.4–76.8)
NEUTROS ABS: 1.7 10*3/uL (ref 1.5–6.5)
Platelets: 204 10*3/uL (ref 145–400)
RBC: 4.67 10*6/uL (ref 3.70–5.45)
RDW: 12.6 % (ref 11.2–14.5)
WBC: 3 10*3/uL — ABNORMAL LOW (ref 3.9–10.3)
lymph#: 1 10*3/uL (ref 0.9–3.3)

## 2014-07-26 LAB — CEA: CEA: 0.5 ng/mL (ref 0.0–5.0)

## 2014-07-26 MED ORDER — IOHEXOL 300 MG/ML  SOLN
100.0000 mL | Freq: Once | INTRAMUSCULAR | Status: AC | PRN
  Administered 2014-07-26: 100 mL via INTRAVENOUS

## 2014-07-27 ENCOUNTER — Telehealth: Payer: Self-pay | Admitting: *Deleted

## 2014-07-27 NOTE — Telephone Encounter (Signed)
Per Dr. Benay Spice; notified pt that CT negative for cancer, f/u as scheduled.  Pt very happy; verbalized understanding and confirmed appt 08/10/14

## 2014-07-27 NOTE — Telephone Encounter (Signed)
-----   Message from Ladell Pier, MD sent at 07/26/2014  6:57 PM EST ----- Please call patient, CTs negative for cancer, f/u as scheduled

## 2014-08-02 ENCOUNTER — Ambulatory Visit: Payer: 59 | Admitting: Oncology

## 2014-08-05 ENCOUNTER — Encounter: Payer: Self-pay | Admitting: Gastroenterology

## 2014-08-05 ENCOUNTER — Ambulatory Visit (AMBULATORY_SURGERY_CENTER): Payer: 59 | Admitting: Gastroenterology

## 2014-08-05 VITALS — BP 134/71 | HR 90 | Temp 94.0°F | Resp 17 | Ht 63.0 in | Wt 136.0 lb

## 2014-08-05 DIAGNOSIS — Z85038 Personal history of other malignant neoplasm of large intestine: Secondary | ICD-10-CM

## 2014-08-05 MED ORDER — SODIUM CHLORIDE 0.9 % IV SOLN: 500.0000 mL | INTRAVENOUS | Status: DC

## 2014-08-05 NOTE — Progress Notes (Signed)
Stable to RR 

## 2014-08-05 NOTE — Patient Instructions (Signed)
YOU HAD AN ENDOSCOPIC PROCEDURE TODAY AT [facility]: Refer to the procedure report that was given to you for any specific questions about what was found during the examination.  If the procedure report does not answer your questions, please call your gastroenterologist to clarify.  If you requested that your care partner not be given the details of your procedure findings, then the procedure report has been included in a sealed envelope for you to review at your convenience later.  YOU SHOULD EXPECT: Some feelings of bloating in the abdomen. Passage of more gas than usual.  Walking can help get rid of the air that was put into your GI tract during the procedure and reduce the bloating. If you had a lower endoscopy (such as a colonoscopy or flexible sigmoidoscopy) you may notice spotting of blood in your stool or on the toilet paper. If you underwent a bowel prep for your procedure, then you may not have a normal bowel movement for a few days.  DIET: Your first meal following the procedure should be a light meal and then it is ok to progress to your normal diet.  A half-sandwich or bowl of soup is an example of a good first meal.  Heavy or fried foods are harder to digest and may make you feel nauseous or bloated.  Likewise meals heavy in dairy and vegetables can cause extra gas to form and this can also increase the bloating.  Drink plenty of fluids but you should avoid alcoholic beverages for 24 hours.  ACTIVITY: Your care partner should take you home directly after the procedure.  You should plan to take it easy, moving slowly for the rest of the day.  You can resume normal activity the day after the procedure however you should NOT DRIVE or use heavy machinery for 24 hours (because of the sedation medicines used during the test).    SYMPTOMS TO REPORT IMMEDIATELY: A gastroenterologist can be reached at any hour.  During normal business hours, 8:30 AM to 5:00 PM Monday through Friday,  call [phone].  After hours and on weekends, please call the GI answering service at [phone] who will take a message and have the physician on call contact you.   Following lower endoscopy (colonoscopy or flexible sigmoidoscopy):  Excessive amounts of blood in the stool  Significant tenderness or worsening of abdominal pains  Swelling of the abdomen that is new, acute  Fever of 100F or higher  FOLLOW UP: Our staff will call the home number listed on your records the next business day following your procedure to check on you and address any questions or concerns that you may have at that time regarding the information given to you following your procedure. This is a courtesy call and so if there is no answer at the home number and we have not heard from you through the emergency physician on call, we will assume that you have returned to your regular daily activities without incident.  SIGNATURES/CONFIDENTIALITY: You and/or your care partner have signed paperwork which will be entered into your electronic medical record.  These signatures attest to the fact that that the information above on your After Visit Summary has been reviewed and is understood.  Full responsibility of the confidentiality of this discharge information lies with you and/or your care-partner.  You might note some irritation in your nose or some drainage.  This may cause feels of congestion.  This is from the oxygen, which can  be irritating.  There is no need for concern, this should clear up in a day or so  Continue your normal medications  Please read over handouts about hemorrhoids and high fiber diets  Next colonoscopy- 2 years

## 2014-08-05 NOTE — Op Note (Signed)
[place]  [employer]. [place], [id number]   COLONOSCOPY PROCEDURE REPORT  PATIENT: Penny Barnes, Pinedo  MR#: [id number] BIRTHDATE: 01-Dec-1960 , 73  yrs. old GENDER: female ENDOSCOPIST: Ladene Artist, MD, [facility] PROCEDURE DATE:  08/05/2014 PROCEDURE:   Colonoscopy, surveillance First Screening Colonoscopy - Avg.  risk and is 50 yrs.  old or older - No.  Prior Negative Screening - Now for repeat screening. N/A  History of Adenoma - Now for follow-up colonoscopy & has been > or = to 3 yrs.  N/A  Polyps Removed Today? No.  Polyps Removed Today? No.  Recommend repeat exam, <10 yrs? Polyps Removed Today? No.  Recommend repeat exam, <10 yrs? Yes.  Polyps Removed Today? No.  Recommend repeat exam, <10 yrs? Yes.  High risk (family or personal hx). ASA CLASS:   Class II INDICATIONS:high risk personal history of colon cancer. MEDICATIONS: Monitored anesthesia care, Propofol 200 mg IV, and lidocaine 40 mg IV DESCRIPTION OF PROCEDURE:   After the risks benefits and alternatives of the procedure were thoroughly explained, informed consent was obtained.  The digital rectal exam revealed no abnormalities of the rectum.   The LB [id number]  endoscope was introduced through the anus and advanced to the cecum, which was identified by both the appendix and ileocecal valve. No adverse events experienced.   The quality of the prep was good, using MoviPrep  The instrument was then slowly withdrawn as the colon was fully examined.    COLON FINDINGS: There was evidence of a prior end-to-end colo-colonic surgical anastomosis in the sigmoid colon.   The examination was otherwise normal.  Retroflexed views revealed internal Grade I hemorrhoids. The time to cecum=2 minutes 30 seconds.  Withdrawal time=9 minutes 55 seconds.  The scope was withdrawn and the procedure completed. COMPLICATIONS: There were no immediate complications.  ENDOSCOPIC IMPRESSION: 1.   Prior colo-colonic  surgical anastomosis in the sigmoid colon 2.   Grade l internal hemorrhoids  RECOMMENDATIONS: 1.  Repeat Colonoscopy in 2 years   eSigned:  Ladene Artist, MD, [facility] 08/05/2014 11:51 AM   cc: Excell Seltzer, MD

## 2014-08-09 ENCOUNTER — Telehealth: Payer: Self-pay | Admitting: *Deleted

## 2014-08-09 NOTE — Telephone Encounter (Signed)
  Follow up Call-  Call back number 08/05/2014 07/24/2013 07/24/2013  Post procedure Call Back phone  # [phone]  Permission to leave phone message Yes - Yes     Patient questions:  Do you have a fever, pain , or abdominal swelling? No. Pain Score  0 *  Have you tolerated food without any problems? Yes.    Have you been able to return to your normal activities? Yes.    Do you have any questions about your discharge instructions: Diet   No. Medications  No. Follow up visit  No.  Do you have questions or concerns about your Care? No.  Actions: * If pain score is 4 or above: No action needed, pain <4.

## 2014-08-10 ENCOUNTER — Ambulatory Visit (HOSPITAL_BASED_OUTPATIENT_CLINIC_OR_DEPARTMENT_OTHER): Payer: 59 | Admitting: Oncology

## 2014-08-10 ENCOUNTER — Telehealth: Payer: Self-pay | Admitting: Oncology

## 2014-08-10 VITALS — BP 132/66 | HR 77 | Temp 98.6°F | Resp 18 | Ht 63.0 in | Wt 145.3 lb

## 2014-08-10 DIAGNOSIS — C187 Malignant neoplasm of sigmoid colon: Secondary | ICD-10-CM

## 2014-08-10 DIAGNOSIS — Z85038 Personal history of other malignant neoplasm of large intestine: Secondary | ICD-10-CM

## 2014-08-10 NOTE — Progress Notes (Signed)
  [facility] OFFICE PROGRESS NOTE   Diagnosis: Colon cancer  INTERVAL HISTORY:   Penny Barnes returns as scheduled. She feels well. She continues to have numbness in the fingers, toes, and distal soles of the feet. This does not interfere with activity. Good appetite. She has hot flashes. She reports intermittent low back discomfort. She underwent a colonoscopy on 08/05/2014. There was a surgical anastomosis in the sigmoid colon. The colon was otherwise normal. Grade 1 internal hemorrhoids.  Objective:  Vital signs in last 24 hours:  Blood pressure 132/66, pulse 77, temperature 98.6 F (37 C), temperature source Oral, resp. rate 18, height $RemoveBe'5\' 3"'BhRjIrwTM$  (1.6 m), weight 145 lb 4.8 oz (65.908 kg), SpO2 100 %.    HEENT: Neck without mass Lymphatics: No cervical, supraclavicular, axillary, or inguinal nodes Resp: Lungs clear bilaterally Cardio: Regular rate and rhythm GI: No hepatomegaly, nontender, no mass Vascular: No leg edema Musculoskeletal: No spine tenderness    Lab Results:  Lab Results  Component Value Date   WBC 3.0* 07/26/2014   HGB 13.9 07/26/2014   HCT 41.6 07/26/2014   MCV 89.1 07/26/2014   PLT 204 07/26/2014   NEUTROABS 1.7 07/26/2014      Lab Results  Component Value Date   CEA 0.5 07/26/2014    Imaging: CTs of the chest, abdomen, and pelvis 07/26/2014-no evidence of metastatic disease Medications: I have reviewed the patient's current medications.  Assessment/Barnes: 1. 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  Cycle 1 adjuvant CAPOX 09/21/2013.   Cycle 2 adjuvant CAPOX 10/12/2013.   Cycle 3 adjuvant CAPOX 11/02/2013.   Cycle 4 adjuvant CAPOX 11/23/2013.   Cycle 5 adjuvant CAPOX 12/14/2013.   Cycle 6 adjuvant CAPOX 01/04/2014   Cycle 7  adjuvant CAPOX 01/25/2014.  Cycle 8 adjuvant CAPOX 02/15/2014.  Surveillance CT scans 07/26/2014-negative for recurrent disease.  Negative colonoscopy 08/05/2014 2. History of delayed nausea following cycle 1 CAPOX. Aloxi added with cycle 2. Emend and prophylactic Decadron added with cycle 3. 3. History of hand-foot syndrome secondary to Xeloda.  4. Diarrhea cycle 3 week 2. Xeloda dose reduced beginning with cycle 4. 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 Mild decrease in vibratory sense. Oxaliplatin dose reduced beginning with cycle  5. Persistent mild neuropathy symptoms.  Disposition:  Penny Barnes remains in clinical remission from colon cancer. She will return for an office visit and CEA in 6 months. She will continue colonoscopy follow-up with Penny Barnes. She will follow-up with her primary physician if the low back discomfort persist. She plans to schedule a mammogram and bone density scan in the near future.  Penny Coder, MD  08/10/2014  8:53 AM

## 2014-08-10 NOTE — Telephone Encounter (Signed)
Gave avs & calendar for July °

## 2014-12-14 ENCOUNTER — Encounter: Payer: Self-pay | Admitting: Family Medicine

## 2014-12-14 ENCOUNTER — Ambulatory Visit (INDEPENDENT_AMBULATORY_CARE_PROVIDER_SITE_OTHER): Payer: 59 | Admitting: Family Medicine

## 2014-12-14 VITALS — BP 118/72 | HR 76 | Temp 98.6°F | Ht 63.0 in | Wt 146.0 lb

## 2014-12-14 DIAGNOSIS — L989 Disorder of the skin and subcutaneous tissue, unspecified: Secondary | ICD-10-CM | POA: Diagnosis not present

## 2014-12-14 DIAGNOSIS — Z0000 Encounter for general adult medical examination without abnormal findings: Secondary | ICD-10-CM | POA: Diagnosis not present

## 2014-12-14 NOTE — Progress Notes (Signed)
   Subjective:    Patient ID: Penny Barnes, female    DOB: 1961-06-24, 54 y.o.   MRN: [id number]  HPI Here for several things. First about 3 weeks ago a lesion appeared on her forehead which has slowly grown larger. It is not changing colors and it is not painful. Also she asks about getting a bone density test. She has never had one.    Review of Systems  Constitutional: Negative.   Respiratory: Negative.   Cardiovascular: Negative.        Objective:   Physical Exam  Constitutional: She appears well-developed and well-nourished.  Cardiovascular: Normal rate, normal heart sounds and intact distal pulses.   Pulmonary/Chest: Effort normal and breath sounds normal.  Skin:  The left forehead has a small papular lesion which is pink and well demarcated.           Assessment & Plan:  The lesion may be an early seborrheic keratosis. We will refer her to Dermatology to evaluate. Set up a bone density test.

## 2014-12-14 NOTE — Progress Notes (Signed)
Pre visit review using our clinic review tool, if applicable. No additional management support is needed unless otherwise documented below in the visit note. 

## 2014-12-16 ENCOUNTER — Ambulatory Visit (INDEPENDENT_AMBULATORY_CARE_PROVIDER_SITE_OTHER)
Admission: RE | Admit: 2014-12-16 | Discharge: 2014-12-16 | Disposition: A | Discharge status: Home / Self Care | Payer: 59 | Source: Ambulatory Visit | Attending: Family Medicine | Admitting: Family Medicine

## 2014-12-16 DIAGNOSIS — Z0000 Encounter for general adult medical examination without abnormal findings: Secondary | ICD-10-CM

## 2014-12-16 DIAGNOSIS — Z13820 Encounter for screening for osteoporosis: Secondary | ICD-10-CM

## 2015-01-18 ENCOUNTER — Other Ambulatory Visit: Payer: Self-pay | Admitting: Obstetrics and Gynecology

## 2015-01-20 LAB — CYTOLOGY - PAP

## 2015-02-09 ENCOUNTER — Telehealth: Payer: Self-pay | Admitting: Nurse Practitioner

## 2015-02-09 ENCOUNTER — Ambulatory Visit (HOSPITAL_BASED_OUTPATIENT_CLINIC_OR_DEPARTMENT_OTHER): Payer: 59 | Admitting: Nurse Practitioner

## 2015-02-09 ENCOUNTER — Other Ambulatory Visit (HOSPITAL_BASED_OUTPATIENT_CLINIC_OR_DEPARTMENT_OTHER): Payer: 59

## 2015-02-09 VITALS — BP 148/75 | HR 76 | Temp 97.7°F | Resp 18 | Ht 63.0 in | Wt 146.0 lb

## 2015-02-09 DIAGNOSIS — Z85038 Personal history of other malignant neoplasm of large intestine: Secondary | ICD-10-CM

## 2015-02-09 DIAGNOSIS — C187 Malignant neoplasm of sigmoid colon: Secondary | ICD-10-CM

## 2015-02-09 DIAGNOSIS — M79602 Pain in left arm: Secondary | ICD-10-CM

## 2015-02-09 DIAGNOSIS — M8580 Other specified disorders of bone density and structure, unspecified site: Secondary | ICD-10-CM | POA: Diagnosis not present

## 2015-02-09 DIAGNOSIS — G620 Drug-induced polyneuropathy: Secondary | ICD-10-CM

## 2015-02-09 LAB — CEA

## 2015-02-09 NOTE — Telephone Encounter (Signed)
per pof to sch pt appt-gave pt copy of avs °

## 2015-02-09 NOTE — Progress Notes (Signed)
  [facility] OFFICE PROGRESS NOTE   Diagnosis:  Colon cancer  INTERVAL HISTORY:   Penny Barnes returns as scheduled. No change in bowel habits. No abdominal pain. No bloody or black stools. She has rare episodes of nausea/vomiting. She feels this is related to "overeating". No numbness in the hands. She has mild numbness in the toes. Over the past few months she has noted intermittent pain at the left upper arm with certain movements. No neck pain. No arm weakness or numbness. She reports a recent bone density study showed osteopenia. She plans to start calcium and vitamin D.  Objective:  Vital signs in last 24 hours:  Blood pressure 148/75, pulse 76, temperature 97.7 F (36.5 C), temperature source Oral, resp. rate 18, height _0  (1.6 m), weight 146 lb (66.225 kg), SpO2 100 %.    HEENT: No thrush or ulcers. Lymphatics: No palpable cervical, supra clavicular, axillary or inguinal lymph nodes. Resp: Lungs clear bilaterally. Cardio: Regular rate and rhythm. GI: Abdomen soft and nontender. No hepatomegaly. Vascular: No leg edema. Skin: No rash.  Musculoskeletal: Left arm is nontender. Crepitus noted with movement of the left shoulder joint.  Lab Results:  Lab Results  Component Value Date   WBC 3.0* 07/26/2014   HGB 13.9 07/26/2014   HCT 41.6 07/26/2014   MCV 89.1 07/26/2014   PLT 204 07/26/2014   NEUTROABS 1.7 07/26/2014    Imaging:  No results found.  Medications: I have reviewed the patient's current medications.  Assessment/Plan: 1. 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  Cycle 1 adjuvant CAPOX 09/21/2013.   Cycle 2 adjuvant CAPOX 10/12/2013.   Cycle 3 adjuvant CAPOX 11/02/2013.   Cycle 4 adjuvant CAPOX 11/23/2013.   Cycle 5 adjuvant CAPOX  12/14/2013.   Cycle 6 adjuvant CAPOX 01/04/2014   Cycle 7 adjuvant CAPOX 01/25/2014.  Cycle 8 adjuvant CAPOX 02/15/2014.  Surveillance CT scans 07/26/2014-negative for recurrent disease.  Negative colonoscopy 08/05/2014 2. History of delayed nausea following cycle 1 CAPOX. Aloxi added with cycle 2. Emend and prophylactic Decadron added with cycle 3. 3. History of hand-foot syndrome secondary to Xeloda.  4. Diarrhea cycle 3 week 2. Xeloda dose reduced beginning with cycle 4. 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 Mild decrease in vibratory sense. Oxaliplatin dose reduced beginning with cycle 5. 5.    Persistent mild neuropathy symptoms.   Disposition: Penny Barnes remains in clinical remission from colon cancer. We will follow-up on the CEA from today. The plan is for a CEA and restaging CT scans in 6 months.  The left arm pain is likely benign musculoskeletal pain. She will follow-up with Dr. Sarajane Jews.  We will see her in follow-up in 6 months to review the CT scan results.  Plan reviewed with Dr. Benay Spice.  Ned Card ANP/GNP-BC   02/09/2015  9:49 AM

## 2015-02-10 ENCOUNTER — Telehealth: Payer: Self-pay

## 2015-02-10 ENCOUNTER — Telehealth: Payer: Self-pay | Admitting: *Deleted

## 2015-02-10 NOTE — Telephone Encounter (Signed)
Per Dr. Sherrill; notified pt that cea is normal.  Pt verbalized understanding and expressed appreciation for call. 

## 2015-02-10 NOTE — Telephone Encounter (Signed)
-----   Message from Ladell Pier, MD sent at 02/09/2015  4:31 PM EDT ----- Please call patient, cea is normal

## 2015-02-10 NOTE — Telephone Encounter (Signed)
Left message for pt to call back about lab results.   CEA is normal.

## 2015-05-26 ENCOUNTER — Encounter: Payer: Self-pay | Admitting: Family Medicine

## 2015-05-26 ENCOUNTER — Ambulatory Visit (INDEPENDENT_AMBULATORY_CARE_PROVIDER_SITE_OTHER): Payer: 59 | Admitting: Family Medicine

## 2015-05-26 VITALS — BP 138/79 | HR 75 | Temp 98.7°F | Ht 63.0 in | Wt 149.0 lb

## 2015-05-26 DIAGNOSIS — J019 Acute sinusitis, unspecified: Secondary | ICD-10-CM | POA: Diagnosis not present

## 2015-05-26 MED ORDER — LEVOFLOXACIN 500 MG PO TABS: 500.0000 mg | ORAL_TABLET | Freq: Every day | ORAL | Status: AC

## 2015-05-26 NOTE — Progress Notes (Signed)
Pre visit review using our clinic review tool, if applicable. No additional management support is needed unless otherwise documented below in the visit note. 

## 2015-05-26 NOTE — Progress Notes (Signed)
   Subjective:    Patient ID: Penny Barnes, female    DOB: August 30, 1960, 54 y.o.   MRN: [id number]  HPI Here for one week of sinus pressure, PND, ST, and a dry cough. On Mucinex.    Review of Systems  Constitutional: Negative.   HENT: Positive for congestion, postnasal drip, sinus pressure and sore throat.   Eyes: Negative.   Respiratory: Positive for cough.        Objective:   Physical Exam  Constitutional: She appears well-developed and well-nourished.  HENT:  Right Ear: External ear normal.  Left Ear: External ear normal.  Nose: Nose normal.  Mouth/Throat: Oropharynx is clear and moist.  Eyes: Conjunctivae are normal.  Neck: No thyromegaly present.  Cardiovascular: Normal rate, regular rhythm, normal heart sounds and intact distal pulses.   Pulmonary/Chest: Effort normal and breath sounds normal.  Lymphadenopathy:    She has no cervical adenopathy.          Assessment & Plan:  Sinusitis, treat with Levaquin.

## 2015-05-27 ENCOUNTER — Telehealth: Payer: Self-pay | Admitting: Family Medicine

## 2015-05-27 MED ORDER — TOBRAMYCIN-DEXAMETHASONE 0.3-0.1 % OP SUSP: 2.0000 [drp] | OPHTHALMIC | Status: DC

## 2015-05-27 NOTE — Telephone Encounter (Signed)
Pt would like sent to [facility]/pharmacy

## 2015-05-27 NOTE — Telephone Encounter (Signed)
I sent script e-scribe and spoke with pt. 

## 2015-05-27 NOTE — Telephone Encounter (Signed)
Woke up again this morning with her left eye matted shut. It is a bit irritated and the discharge is yellow. Are there drops that can be called in for her? Please call her to let her know if something does get called in for her.

## 2015-05-27 NOTE — Telephone Encounter (Signed)
Call in Tobradex eye drops to use 2 drops to the eye every 4 hours prn. 10 ml with no rf

## 2015-07-22 ENCOUNTER — Ambulatory Visit (INDEPENDENT_AMBULATORY_CARE_PROVIDER_SITE_OTHER): Payer: 59 | Admitting: Family Medicine

## 2015-07-22 ENCOUNTER — Encounter: Payer: Self-pay | Admitting: Family Medicine

## 2015-07-22 VITALS — BP 150/74 | HR 92 | Temp 98.7°F | Ht 63.0 in | Wt 149.0 lb

## 2015-07-22 DIAGNOSIS — R309 Painful micturition, unspecified: Secondary | ICD-10-CM

## 2015-07-22 DIAGNOSIS — N390 Urinary tract infection, site not specified: Secondary | ICD-10-CM

## 2015-07-22 DIAGNOSIS — M542 Cervicalgia: Secondary | ICD-10-CM | POA: Diagnosis not present

## 2015-07-22 LAB — POCT URINALYSIS DIPSTICK
Bilirubin, UA: NEGATIVE
GLUCOSE UA: NEGATIVE
KETONES UA: NEGATIVE
Nitrite, UA: NEGATIVE
Protein, UA: NEGATIVE
RBC UA: NEGATIVE
SPEC GRAV UA: 1.01
Urobilinogen, UA: 0.2
pH, UA: 6.5

## 2015-07-22 MED ORDER — CIPROFLOXACIN HCL 500 MG PO TABS: 500.0000 mg | ORAL_TABLET | Freq: Two times a day (BID) | ORAL | Status: DC

## 2015-07-22 NOTE — Progress Notes (Signed)
   Subjective:    Patient ID: Penny Barnes, female    DOB: 02-26-1961, 55 y.o.   MRN: [id number]  HPI Here for 2 things. First she has had 3 days of urinary urgency, burning, and foul odor. No fever. She always drinks a lot of water. Also for one week she has had stiffness and pain in the right neck area. No recent trauma. Using heat.    Review of Systems  Constitutional: Negative.   HENT: Negative.   Respiratory: Negative.   Cardiovascular: Negative.   Genitourinary: Positive for dysuria, urgency and frequency. Negative for flank pain and pelvic pain.  Musculoskeletal: Positive for arthralgias.       Objective:   Physical Exam  Constitutional: She appears well-developed and well-nourished.  Cardiovascular: Normal rate, regular rhythm, normal heart sounds and intact distal pulses.   Pulmonary/Chest: Effort normal and breath sounds normal.  Abdominal: Soft. Bowel sounds are normal. She exhibits no distension and no mass. There is no tenderness. There is no rebound and no guarding.  Musculoskeletal:  tender along the right posterior neck, full ROM           Assessment & Plan:  For the UTI, treat with Cipro and culture the sample. For the neck pain, try Ibuprofen and get a gentle massage.

## 2015-07-22 NOTE — Progress Notes (Signed)
Pre visit review using our clinic review tool, if applicable. No additional management support is needed unless otherwise documented below in the visit note. 

## 2015-07-25 LAB — URINE CULTURE

## 2015-08-12 ENCOUNTER — Ambulatory Visit (HOSPITAL_COMMUNITY)
Admission: RE | Admit: 2015-08-12 | Discharge: 2015-08-12 | Disposition: A | Discharge status: Home / Self Care | Payer: 59 | Source: Ambulatory Visit | Attending: Nurse Practitioner | Admitting: Nurse Practitioner

## 2015-08-12 ENCOUNTER — Other Ambulatory Visit (HOSPITAL_BASED_OUTPATIENT_CLINIC_OR_DEPARTMENT_OTHER): Payer: 59

## 2015-08-12 ENCOUNTER — Encounter (HOSPITAL_COMMUNITY): Payer: Self-pay

## 2015-08-12 ENCOUNTER — Telehealth: Payer: Self-pay | Admitting: *Deleted

## 2015-08-12 DIAGNOSIS — C187 Malignant neoplasm of sigmoid colon: Secondary | ICD-10-CM

## 2015-08-12 DIAGNOSIS — K5900 Constipation, unspecified: Secondary | ICD-10-CM | POA: Insufficient documentation

## 2015-08-12 DIAGNOSIS — Z85038 Personal history of other malignant neoplasm of large intestine: Secondary | ICD-10-CM

## 2015-08-12 DIAGNOSIS — R935 Abnormal findings on diagnostic imaging of other abdominal regions, including retroperitoneum: Secondary | ICD-10-CM | POA: Insufficient documentation

## 2015-08-12 LAB — COMPREHENSIVE METABOLIC PANEL
ALT: 19 U/L (ref 0–55)
AST: 17 U/L (ref 5–34)
Albumin: 4.2 g/dL (ref 3.5–5.0)
Alkaline Phosphatase: 79 U/L (ref 40–150)
Anion Gap: 7 mEq/L (ref 3–11)
BILIRUBIN TOTAL: 0.38 mg/dL (ref 0.20–1.20)
BUN: 16.4 mg/dL (ref 7.0–26.0)
CALCIUM: 9.5 mg/dL (ref 8.4–10.4)
CHLORIDE: 108 meq/L (ref 98–109)
CO2: 27 mEq/L (ref 22–29)
CREATININE: 0.8 mg/dL (ref 0.6–1.1)
EGFR: 85 mL/min/{1.73_m2} — ABNORMAL LOW (ref >90)
Glucose: 102 mg/dl (ref 70–140)
Potassium: 4.4 mEq/L (ref 3.5–5.1)
Sodium: 142 mEq/L (ref 136–145)
TOTAL PROTEIN: 7.8 g/dL (ref 6.4–8.3)

## 2015-08-12 MED ORDER — IOHEXOL 300 MG/ML  SOLN
100.0000 mL | Freq: Once | INTRAMUSCULAR | Status: AC | PRN
  Administered 2015-08-12: 100 mL via INTRAVENOUS

## 2015-08-12 NOTE — Telephone Encounter (Signed)
-----   Message from Ladell Pier, MD sent at 08/12/2015  1:12 PM EST ----- Please call patient, CT negative for cancer

## 2015-08-12 NOTE — Telephone Encounter (Signed)
Called pt with CT results. Negative for cancer, per Dr. Benay Spice. She voiced understanding, next appointment confirmed.

## 2015-08-13 LAB — CEA (PARALLEL TESTING): CEA: <0.5 ng/mL (ref 0.0–5.0)

## 2015-08-13 LAB — CEA: CEA: 1.1 ng/mL (ref 0.0–4.7)

## 2015-08-16 ENCOUNTER — Ambulatory Visit (HOSPITAL_BASED_OUTPATIENT_CLINIC_OR_DEPARTMENT_OTHER): Payer: 59 | Admitting: Oncology

## 2015-08-16 ENCOUNTER — Telehealth: Payer: Self-pay | Admitting: Nurse Practitioner

## 2015-08-16 VITALS — BP 135/73 | HR 79 | Temp 98.3°F | Resp 18 | Ht 63.0 in | Wt 149.2 lb

## 2015-08-16 DIAGNOSIS — R209 Unspecified disturbances of skin sensation: Secondary | ICD-10-CM

## 2015-08-16 DIAGNOSIS — M2550 Pain in unspecified joint: Secondary | ICD-10-CM | POA: Diagnosis not present

## 2015-08-16 DIAGNOSIS — C187 Malignant neoplasm of sigmoid colon: Secondary | ICD-10-CM

## 2015-08-16 DIAGNOSIS — Z85038 Personal history of other malignant neoplasm of large intestine: Secondary | ICD-10-CM

## 2015-08-16 NOTE — Progress Notes (Signed)
  [place] OFFICE PROGRESS NOTE   Diagnosis: Colon cancer  INTERVAL HISTORY:   Penny Barnes returns as scheduled. She feels well. She has mild numbness in the toes. She has arthralgias. She did of urinary tract infection after starting a probiotic. Her bowels move approximate 3 days per week. No bleeding.  Objective:  Vital signs in last 24 hours:  Blood pressure 135/73, pulse 79, temperature 98.3 F (36.8 C), temperature source Oral, resp. rate 18, height 5' 3" (1.6 m), weight 149 lb 3.2 oz (67.677 kg), SpO2 100 %.    HEENT: Neck without mass Lymphatics: No cervical, supra-clavicular, axillary, or inguinal nodes Resp: Lungs clear bilaterally Cardio: Regular rate and rhythm GI: No hepatosplenomegaly, no mass, nontender Vascular: No leg edema   Lab Results:  Lab Results  Component Value Date   WBC 3.0* 07/26/2014   HGB 13.9 07/26/2014   HCT 41.6 07/26/2014   MCV 89.1 07/26/2014   PLT 204 07/26/2014   NEUTROABS 1.7 07/26/2014    Lab Results  Component Value Date   CEA <0.5 08/12/2015     Medications: I have reviewed the patient's current medications.  Assessment/Plan: 1. 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  Cycle 1 adjuvant CAPOX 09/21/2013.   Cycle 2 adjuvant CAPOX 10/12/2013.   Cycle 3 adjuvant CAPOX 11/02/2013.   Cycle 4 adjuvant CAPOX 11/23/2013.   Cycle 5 adjuvant CAPOX 12/14/2013.   Cycle 6 adjuvant CAPOX 01/04/2014   Cycle 7 adjuvant CAPOX 01/25/2014.  Cycle 8 adjuvant CAPOX 02/15/2014.  Surveillance CT scans 07/26/2014-negative for recurrent disease.  Negative colonoscopy 08/05/2014  Surveillance CT scans 08/12/2015-negative for recurrent disease 2. History of delayed nausea following cycle 1 CAPOX. Aloxi added with cycle 2.  Emend and prophylactic Decadron added with cycle 3. 3. History of hand-foot syndrome secondary to Xeloda.  4. Diarrhea cycle 3 week 2. Xeloda dose reduced beginning with cycle 4. 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 Mild decrease in vibratory sense. Oxaliplatin dose reduced beginning with cycle 5. 5. Persistent mild neuropathy symptoms. 6.   Possible polyp in the gallbladder on the surveillance CT scan 08/12/2015    Disposition:  Ms. Bryngelson remains in clinical remission from colon cancer. She will return for an office visit and CEA in 6 months. I will contact Dr. Excell Seltzer and present her case at the GI tumor conference to discuss the indication for evaluating the abnormal gallbladder finding.  Betsy Coder, MD  08/16/2015  12:19 PM

## 2015-08-16 NOTE — Telephone Encounter (Signed)
Talked with and scheduled this patient’s appointment(s) while patient was here in our office.       [facility]. °

## 2015-08-22 ENCOUNTER — Other Ambulatory Visit: Payer: Self-pay | Admitting: General Surgery

## 2015-08-22 DIAGNOSIS — R935 Abnormal findings on diagnostic imaging of other abdominal regions, including retroperitoneum: Secondary | ICD-10-CM

## 2015-08-31 ENCOUNTER — Other Ambulatory Visit: Payer: 59

## 2015-09-01 ENCOUNTER — Ambulatory Visit
Admission: RE | Admit: 2015-09-01 | Discharge: 2015-09-01 | Disposition: A | Discharge status: Home / Self Care | Payer: 59 | Source: Ambulatory Visit | Attending: General Surgery | Admitting: General Surgery

## 2015-09-01 DIAGNOSIS — R935 Abnormal findings on diagnostic imaging of other abdominal regions, including retroperitoneum: Secondary | ICD-10-CM

## 2015-09-14 HISTORY — PX: CHOLECYSTECTOMY: SHX55

## 2015-09-20 ENCOUNTER — Other Ambulatory Visit: Payer: Self-pay | Admitting: General Surgery

## 2016-02-13 ENCOUNTER — Ambulatory Visit (HOSPITAL_BASED_OUTPATIENT_CLINIC_OR_DEPARTMENT_OTHER): Payer: 59 | Admitting: Nurse Practitioner

## 2016-02-13 ENCOUNTER — Other Ambulatory Visit (HOSPITAL_BASED_OUTPATIENT_CLINIC_OR_DEPARTMENT_OTHER): Payer: 59

## 2016-02-13 ENCOUNTER — Telehealth: Payer: Self-pay | Admitting: Oncology

## 2016-02-13 VITALS — BP 138/67 | HR 77 | Temp 98.4°F | Resp 18 | Ht 63.0 in | Wt 149.1 lb

## 2016-02-13 DIAGNOSIS — Z85038 Personal history of other malignant neoplasm of large intestine: Secondary | ICD-10-CM

## 2016-02-13 DIAGNOSIS — C187 Malignant neoplasm of sigmoid colon: Secondary | ICD-10-CM

## 2016-02-13 DIAGNOSIS — R209 Unspecified disturbances of skin sensation: Secondary | ICD-10-CM | POA: Diagnosis not present

## 2016-02-13 NOTE — Telephone Encounter (Signed)
Gave pt cal & avs °

## 2016-02-13 NOTE — Progress Notes (Signed)
  [place] OFFICE PROGRESS NOTE   Diagnosis:  Colon cancer  INTERVAL HISTORY:   Penny Barnes returns as scheduled. She feels well. No change in bowel habits. No bloody or black stools. No pain with bowel movements. She denies abdominal pain. She has a good appetite. She notes numbness in her toes at night time. No associated pain.  She reports her gallbladder was removed in March of this year.  Objective:  Vital signs in last 24 hours:  Blood pressure 138/67, pulse 77, temperature 98.4 F (36.9 C), temperature source Oral, resp. rate 18, height '5\' 3"'$  (1.6 m), weight 149 lb 1.6 oz (67.6 kg), SpO2 100 %.    HEENT: No thrush or ulcers. Lymphatics: No palpable cervical, supraclavicular, axillary or inguinal lymph nodes. Resp: Lungs clear bilaterally. Cardio: Regular rate and rhythm. GI: Abdomen soft and nontender. No hepatomegaly. No mass. Vascular: No leg edema.   Lab Results:  Lab Results  Component Value Date   WBC 3.0 (L) 07/26/2014   HGB 13.9 07/26/2014   HCT 41.6 07/26/2014   MCV 89.1 07/26/2014   PLT 204 07/26/2014   NEUTROABS 1.7 07/26/2014   CEA pending Imaging:  No results found.  Medications: I have reviewed the patient's current medications.  Assessment/Plan: 1. 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  Cycle 1 adjuvant CAPOX 09/21/2013.   Cycle 2 adjuvant CAPOX 10/12/2013.   Cycle 3 adjuvant CAPOX 11/02/2013.   Cycle 4 adjuvant CAPOX 11/23/2013.   Cycle 5 adjuvant CAPOX 12/14/2013.   Cycle 6 adjuvant CAPOX 01/04/2014   Cycle 7 adjuvant CAPOX 01/25/2014.  Cycle 8 adjuvant CAPOX 02/15/2014.  Surveillance CT scans 07/26/2014-negative for recurrent disease.  Negative colonoscopy 08/05/2014. Next colonoscopy at a 2 year  interval.  Surveillance CT scans 08/12/2015-negative for recurrent disease 2. History of delayed nausea following cycle 1 CAPOX. Aloxi added with cycle 2. Emend and prophylactic Decadron added with cycle 3. 3. History of hand-foot syndrome secondary to Xeloda.  4. Diarrhea cycle 3 week 2. Xeloda dose reduced beginning with cycle 4. 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 Mild decrease in vibratory sense. Oxaliplatin dose reduced beginning with cycle 5. 5. Persistent mild neuropathy symptoms. 6.   Possible polyp in the gallbladder on the surveillance CT scan 08/12/2015. Abdominal ultrasound 09/01/2015 with several gallbladder wall polyps with the largest measuring 6.5 mm. Status post cholecystectomy 09/20/2015. Pathology showed chronic cholecystitis. No evidence of malignancy.   Disposition: Penny Barnes remains in clinical remission from colon cancer. We will follow-up on the CEA from today. She will have surveillance CT scans in 6 months. She will return for a follow-up visit 2-3 days after the scans to review the results. She will contact the office in the interim with any problems.    Ned Card ANP/GNP-BC   02/13/2016  9:54 AM

## 2016-02-14 ENCOUNTER — Telehealth: Payer: Self-pay

## 2016-02-14 LAB — CEA (PARALLEL TESTING): CEA: <0.5 ng/mL

## 2016-02-14 LAB — CEA: CEA: 1.2 ng/mL (ref 0.0–4.7)

## 2016-02-14 NOTE — Telephone Encounter (Signed)
Called and informed patient of normal cea results. Pt verbalized understanding and denies any questions or concerns at this time.

## 2016-02-14 NOTE — Telephone Encounter (Signed)
-----   Message from Ladell Pier, MD sent at 02/14/2016  1:31 PM EDT ----- Please call patient, cea is normal

## 2016-05-17 ENCOUNTER — Encounter: Payer: Self-pay | Admitting: Gastroenterology

## 2016-05-28 ENCOUNTER — Encounter: Payer: Self-pay | Admitting: Gastroenterology

## 2016-06-19 ENCOUNTER — Encounter: Payer: Self-pay | Admitting: Gastroenterology

## 2016-07-11 ENCOUNTER — Other Ambulatory Visit: Payer: Self-pay | Admitting: Nurse Practitioner

## 2016-07-26 ENCOUNTER — Ambulatory Visit (AMBULATORY_SURGERY_CENTER): Payer: Self-pay | Admitting: *Deleted

## 2016-07-26 VITALS — Ht 63.0 in | Wt 152.6 lb

## 2016-07-26 DIAGNOSIS — Z85038 Personal history of other malignant neoplasm of large intestine: Secondary | ICD-10-CM

## 2016-07-26 MED ORDER — NA SULFATE-K SULFATE-MG SULF 17.5-3.13-1.6 GM/177ML PO SOLN: 1.0000 | Freq: Once | ORAL | 0 refills | Status: AC

## 2016-07-26 NOTE — Progress Notes (Signed)
Denies allergies to eggs or soy products. Denies complications with sedation or anesthesia. Denies O2 use. Denies use of diet or weight loss medications.  Emmi instructions given for colonoscopy.  

## 2016-07-30 ENCOUNTER — Encounter: Payer: Self-pay | Admitting: Gastroenterology

## 2016-08-06 ENCOUNTER — Ambulatory Visit (AMBULATORY_SURGERY_CENTER): Payer: 59 | Admitting: Gastroenterology

## 2016-08-06 ENCOUNTER — Encounter: Payer: Self-pay | Admitting: Gastroenterology

## 2016-08-06 VITALS — BP 107/60 | HR 79 | Temp 98.6°F | Resp 13 | Ht 63.0 in | Wt 152.0 lb

## 2016-08-06 DIAGNOSIS — Z86010 Personal history of colonic polyps: Secondary | ICD-10-CM | POA: Diagnosis not present

## 2016-08-06 DIAGNOSIS — Z85038 Personal history of other malignant neoplasm of large intestine: Secondary | ICD-10-CM | POA: Diagnosis not present

## 2016-08-06 MED ORDER — SODIUM CHLORIDE 0.9 % IV SOLN: 500.0000 mL | INTRAVENOUS | Status: DC

## 2016-08-06 NOTE — Progress Notes (Signed)
Report given to PACU RN, vss 

## 2016-08-06 NOTE — Patient Instructions (Signed)
YOU HAD AN ENDOSCOPIC PROCEDURE TODAY AT THE [facility]:   Refer to the procedure report that was given to you for any specific questions about what was found during the examination.  If the procedure report does not answer your questions, please call your gastroenterologist to clarify.  If you requested that your care partner not be given the details of your procedure findings, then the procedure report has been included in a sealed envelope for you to review at your convenience later.  YOU SHOULD EXPECT: Some feelings of bloating in the abdomen. Passage of more gas than usual.  Walking can help get rid of the air that was put into your GI tract during the procedure and reduce the bloating. If you had a lower endoscopy (such as a colonoscopy or flexible sigmoidoscopy) you may notice spotting of blood in your stool or on the toilet paper. If you underwent a bowel prep for your procedure, you may not have a normal bowel movement for a few days.  Please Note:  You might notice some irritation and congestion in your nose or some drainage.  This is from the oxygen used during your procedure.  There is no need for concern and it should clear up in a day or so.  SYMPTOMS TO REPORT IMMEDIATELY:   Following lower endoscopy (colonoscopy or flexible sigmoidoscopy):  Excessive amounts of blood in the stool  Significant tenderness or worsening of abdominal pains  Swelling of the abdomen that is new, acute  Fever of 100F or higher   For urgent or emergent issues, a gastroenterologist can be reached at any hour by calling [phone]. Please read all handouts given to you by your recovery nurse.   DIET:  We do recommend a small meal at first, but then you may proceed to your regular diet.  Drink plenty of fluids but you should avoid alcoholic beverages for 24 hours.  ACTIVITY:  You should plan to take it easy for the rest of today and you should NOT DRIVE or use heavy machinery until  tomorrow (because of the sedation medicines used during the test).    FOLLOW UP: Our staff will call the number listed on your records the next business day following your procedure to check on you and address any questions or concerns that you may have regarding the information given to you following your procedure. If we do not reach you, we will leave a message.  However, if you are feeling well and you are not experiencing any problems, there is no need to return our call.  We will assume that you have returned to your regular daily activities without incident.  If any biopsies were taken you will be contacted by phone or by letter within the next 1-3 weeks.  Please call us at [phone] if you have not heard about the biopsies in 3 weeks.    SIGNATURES/CONFIDENTIALITY: You and/or your care partner have signed paperwork which will be entered into your electronic medical record.  These signatures attest to the fact that that the information above on your After Visit Summary has been reviewed and is understood.  Full responsibility of the confidentiality of this discharge information lies with you and/or your care-partner.  Thank you for letting us take care of your healthcare needs today. 

## 2016-08-06 NOTE — Op Note (Signed)
[facility] Patient Name: Penny Barnes Procedure Date: 08/06/2016 2:22 PM MRN: [id number] Endoscopist: Ladene Artist , MD Age: 56 Referring MD:  Date of Birth: 02/29/1961 Gender: Female Account #: [id number] Procedure:                Colonoscopy Indications:              High risk colon cancer surveillance: Personal                            history of colon cancer Medicines:                Monitored Anesthesia Care Procedure:                Pre-Anesthesia Assessment:                           - Prior to the procedure, a History and Physical                            was performed, and patient medications and                            allergies were reviewed. The patient's tolerance of                            previous anesthesia was also reviewed. The risks                            and benefits of the procedure and the sedation                            options and risks were discussed with the patient.                            All questions were answered, and informed consent                            was obtained. Prior Anticoagulants: The patient has                            taken no previous anticoagulant or antiplatelet                            agents. ASA Grade Assessment: II - A patient with                            mild systemic disease. After reviewing the risks                            and benefits, the patient was deemed in                            satisfactory condition to undergo the procedure.  After obtaining informed consent, the colonoscope                            was passed under direct vision. Throughout the                            procedure, the patient's blood pressure, pulse, and                            oxygen saturations were monitored continuously. The                            Model PCF-H190L [id number]) scope was introduced                            through the anus and advanced to the the  cecum,                            identified by appendiceal orifice and ileocecal                            valve. The ileocecal valve, appendiceal orifice,                            and rectum were photographed. The quality of the                            bowel preparation was excellent. The colonoscopy                            was performed without difficulty. The patient                            tolerated the procedure well. Scope In: 2:28:19 PM Scope Out: 2:39:01 PM Scope Withdrawal Time: 0 hours 8 minutes 40 seconds  Total Procedure Duration: 0 hours 10 minutes 42 seconds  Findings:                 The perianal and digital rectal examinations were                            normal.                           Internal hemorrhoids were found during                            retroflexion. The hemorrhoids were small and Grade                            I (internal hemorrhoids that do not prolapse).                           There was evidence of a prior end-to-end  colo-colonic anastomosis in the sigmoid colon. This                            was patent and was characterized by healthy                            appearing mucosa. The anastomosis was traversed.                           The exam was otherwise without abnormality on                            direct and retroflexion views. Complications:            No immediate complications. Estimated blood loss:                            None. Estimated Blood Loss:     Estimated blood loss: none. Impression:               - Internal hemorrhoids.                           - Patent end-to-end colo-colonic anastomosis,                            characterized by healthy appearing mucosa.                           - The examination was otherwise normal on direct                            and retroflexion views.                           - No specimens collected. Recommendation:           - Repeat  colonoscopy in 3 years for surveillance.                           - Patient has a contact number available for                            emergencies. The signs and symptoms of potential                            delayed complications were discussed with the                            patient. Return to normal activities tomorrow.                            Written discharge instructions were provided to the                            patient.                           -  Resume previous diet.                           - Continue present medications. Ladene Artist, MD 08/06/2016 2:41:40 PM This report has been signed electronically.

## 2016-08-07 ENCOUNTER — Telehealth: Payer: Self-pay

## 2016-08-07 NOTE — Telephone Encounter (Signed)
  Follow up Call-  Call back number 08/06/2016 08/05/2014  Post procedure Call Back phone  # [phone]  Permission to leave phone message Yes Yes  Some recent data might be hidden     Patient was called for follow up after her procedure on 08/06/2016. No answer at the number given for follow up phone call. A message was left on the answering machine.

## 2016-08-07 NOTE — Telephone Encounter (Signed)
  Follow up Call-  Call back number 08/06/2016 08/05/2014  Post procedure Call Back phone  # [phone]  Permission to leave phone message Yes Yes  Some recent data might be hidden     Patient questions:  Do you have a fever, pain , or abdominal swelling? No. Pain Score  0 *  Have you tolerated food without any problems? Yes.    Have you been able to return to your normal activities? Yes.    Do you have any questions about your discharge instructions: Diet   No. Medications  No. Follow up visit  No.  Do you have questions or concerns about your Care? No.  Actions: * If pain score is 4 or above: No action needed, pain <4.

## 2016-08-15 ENCOUNTER — Ambulatory Visit (HOSPITAL_COMMUNITY)
Admission: RE | Admit: 2016-08-15 | Discharge: 2016-08-15 | Disposition: A | Discharge status: Home / Self Care | Payer: 59 | Source: Ambulatory Visit | Attending: Nurse Practitioner | Admitting: Nurse Practitioner

## 2016-08-15 ENCOUNTER — Encounter (HOSPITAL_COMMUNITY): Payer: Self-pay

## 2016-08-15 ENCOUNTER — Other Ambulatory Visit (HOSPITAL_BASED_OUTPATIENT_CLINIC_OR_DEPARTMENT_OTHER): Payer: 59

## 2016-08-15 DIAGNOSIS — Z85038 Personal history of other malignant neoplasm of large intestine: Secondary | ICD-10-CM | POA: Diagnosis not present

## 2016-08-15 DIAGNOSIS — C187 Malignant neoplasm of sigmoid colon: Secondary | ICD-10-CM | POA: Diagnosis not present

## 2016-08-15 DIAGNOSIS — C189 Malignant neoplasm of colon, unspecified: Secondary | ICD-10-CM | POA: Diagnosis not present

## 2016-08-15 LAB — COMPREHENSIVE METABOLIC PANEL
ALT: 15 U/L (ref 0–55)
ANION GAP: 7 meq/L (ref 3–11)
AST: 17 U/L (ref 5–34)
Albumin: 4.2 g/dL (ref 3.5–5.0)
Alkaline Phosphatase: 87 U/L (ref 40–150)
BILIRUBIN TOTAL: 0.46 mg/dL (ref 0.20–1.20)
BUN: 13.6 mg/dL (ref 7.0–26.0)
CHLORIDE: 107 meq/L (ref 98–109)
CO2: 28 meq/L (ref 22–29)
CREATININE: 0.7 mg/dL (ref 0.6–1.1)
Calcium: 9.6 mg/dL (ref 8.4–10.4)
EGFR: >90 mL/min/{1.73_m2} (ref >90)
GLUCOSE: 95 mg/dL (ref 70–140)
Potassium: 4.5 mEq/L (ref 3.5–5.1)
SODIUM: 142 meq/L (ref 136–145)
TOTAL PROTEIN: 7.8 g/dL (ref 6.4–8.3)

## 2016-08-15 LAB — CEA (IN HOUSE-CHCC): CEA (CHCC-In House): <1 ng/mL (ref 0.00–5.00)

## 2016-08-15 MED ORDER — IOPAMIDOL (ISOVUE-300) INJECTION 61%
INTRAVENOUS | Status: AC
  Administered 2016-08-15: 100 mL
  Filled 2016-08-15: qty 100

## 2016-08-15 MED ORDER — SODIUM CHLORIDE 0.9 % IJ SOLN
INTRAMUSCULAR | Status: AC
  Filled 2016-08-15: qty 50

## 2016-08-16 LAB — CEA: CEA: 0.9 ng/mL (ref 0.0–4.7)

## 2016-08-17 ENCOUNTER — Telehealth: Payer: Self-pay | Admitting: *Deleted

## 2016-08-17 NOTE — Telephone Encounter (Signed)
Call received from patient requesting her CEA and CT results.  Patient informed per Dr. Benay Spice that CT's are negative for cancer and given CEA results.  Patient appreciative of information and has no further questions at this time.

## 2016-08-17 NOTE — Telephone Encounter (Signed)
-----   Message from Ladell Pier, MD sent at 08/16/2016  8:44 PM EST ----- Please call patient, cts are negative for cancer, f/u as scheduled

## 2016-08-20 ENCOUNTER — Telehealth: Payer: Self-pay | Admitting: Oncology

## 2016-08-20 ENCOUNTER — Ambulatory Visit (HOSPITAL_BASED_OUTPATIENT_CLINIC_OR_DEPARTMENT_OTHER): Payer: 59 | Admitting: Oncology

## 2016-08-20 VITALS — BP 150/73 | HR 71 | Temp 98.5°F | Resp 18 | Ht 63.0 in | Wt 152.2 lb

## 2016-08-20 DIAGNOSIS — Z85038 Personal history of other malignant neoplasm of large intestine: Secondary | ICD-10-CM

## 2016-08-20 DIAGNOSIS — C187 Malignant neoplasm of sigmoid colon: Secondary | ICD-10-CM

## 2016-08-20 DIAGNOSIS — G620 Drug-induced polyneuropathy: Secondary | ICD-10-CM | POA: Diagnosis not present

## 2016-08-20 NOTE — Progress Notes (Signed)
  [facility] OFFICE PROGRESS NOTE   Diagnosis: Colon cancer  INTERVAL HISTORY:   Penny Barnes returns as scheduled. She feels well. She has mild numbness feet in the mornings. This does not interfere with activity. No hand numbness. A surveillance colonoscopy 08/06/2016 was negative.   Objective:  Vital signs in last 24 hours:  Blood pressure (!) 150/73, pulse 71, temperature 98.5 F (36.9 C), temperature source Oral, resp. rate 18, height _0  (1.6 m), weight 152 lb 3.2 oz (69 kg), SpO2 100 %.    HEENT: Neck without mass Lymphatics: No cervical, supraclavicular, axillary, or inguinal nodes Resp: Lungs clear bilaterally Cardio: Regular rate and rhythm GI: No hepatomegaly, nontender, no mass Vascular: No leg edema   Lab Results: 08/15/2016: CEA-less than 1  Imaging: CTs of the chest, abdomen, and pelvis 08/15/2016-no evidence of metastatic disease   Medications: I have reviewed the patient's current medications.  Assessment/Plan: 1. 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  Cycle 1 adjuvant CAPOX 09/21/2013.   Cycle 2 adjuvant CAPOX 10/12/2013.   Cycle 3 adjuvant CAPOX 11/02/2013.   Cycle 4 adjuvant CAPOX 11/23/2013.   Cycle 5 adjuvant CAPOX 12/14/2013.   Cycle 6 adjuvant CAPOX 01/04/2014   Cycle 7 adjuvant CAPOX 01/25/2014.  Cycle 8 adjuvant CAPOX 02/15/2014.  Surveillance CT scans 07/26/2014-negative for recurrent disease.  Negative colonoscopy 08/05/2014. Next colonoscopy at a 2 year interval.  Surveillance CT scans 08/12/2015-negative for recurrent disease  Surveillance colonoscopy 08/06/2016-negative  Surveillance CT scans 08/15/2016-negative for metastatic disease 2. History of delayed nausea following cycle 1 CAPOX. Aloxi added with cycle  2. Emend and prophylactic Decadron added with cycle 3. 3. History of hand-foot syndrome secondary to Xeloda.  4. Diarrhea cycle 3 week 2. Xeloda dose reduced beginning with cycle 4. 5. Oxaliplatin neuropathy, Persistent mild neuropathy symptoms. 6. Possible polyp in the gallbladder on the surveillance CT scan 08/12/2015. Abdominal ultrasound 09/01/2015 with several gallbladder wall polyps with the largest measuring 6.5 mm. Status post cholecystectomy 09/20/2015. Pathology showed chronic cholecystitis. No evidence of malignancy.      Disposition:  Penny Barnes remains in clinical remission from colon cancer. She will return for an office visit and CEA in 6 months.  15 minutes were spent with the patient today. The majority of the time was used for counseling and coordination of care.  Betsy Coder, MD  08/20/2016  9:24 AM

## 2016-08-20 NOTE — Telephone Encounter (Signed)
Appointments scheduled per 08/20/16 los. Patient was given a copy of the appointment schedule and AVS report, per 08/20/16 los. °

## 2016-10-01 DIAGNOSIS — J029 Acute pharyngitis, unspecified: Secondary | ICD-10-CM | POA: Diagnosis not present

## 2016-10-01 DIAGNOSIS — J020 Streptococcal pharyngitis: Secondary | ICD-10-CM | POA: Diagnosis not present

## 2016-10-05 ENCOUNTER — Ambulatory Visit (INDEPENDENT_AMBULATORY_CARE_PROVIDER_SITE_OTHER): Payer: 59 | Admitting: Family Medicine

## 2016-10-05 ENCOUNTER — Encounter: Payer: Self-pay | Admitting: Family Medicine

## 2016-10-05 VITALS — BP 152/92 | HR 72 | Temp 98.1°F | Wt 152.4 lb

## 2016-10-05 DIAGNOSIS — J018 Other acute sinusitis: Secondary | ICD-10-CM

## 2016-10-05 MED ORDER — AMOXICILLIN-POT CLAVULANATE 875-125 MG PO TABS: 1.0000 | ORAL_TABLET | Freq: Two times a day (BID) | ORAL | 0 refills | Status: DC

## 2016-10-05 NOTE — Progress Notes (Signed)
Pre visit review using our clinic review tool, if applicable. No additional management support is needed unless otherwise documented below in the visit note. 

## 2016-10-05 NOTE — Progress Notes (Signed)
   Subjective:    Patient ID: NEOMIA HERBEL, female    DOB: 01/30/1961, 56 y.o.   MRN: [id number]  HPI Here for continued URI symptoms. This all started 6 days ago with a ST. No other symptoms at that time. She went to a [facility] on 10-01-16 and had a positive rapid strep test. Se was given Penicillin VK, but the ST never improved. Now in addition she has sinus pressure, PND, hoarse voice, and is coughing up green sputum. No fever.   Review of Systems  Constitutional: Negative.   HENT: Positive for congestion, postnasal drip, sinus pain, sinus pressure and sore throat.   Eyes: Negative.   Respiratory: Positive for cough. Negative for shortness of breath and wheezing.   Cardiovascular: Negative.        Objective:   Physical Exam  Constitutional: She appears well-developed and well-nourished.  HENT:  Right Ear: External ear normal.  Left Ear: External ear normal.  Nose: Nose normal.  Mouth/Throat: Oropharynx is clear and moist.  Eyes: Conjunctivae are normal.  Neck: No thyromegaly present.  Pulmonary/Chest: Effort normal and breath sounds normal.  Lymphadenopathy:    She has no cervical adenopathy.          Assessment & Plan:  This sounds like a sinusitis, and I think her positive strep test may have been misleading. We will stop the Penicillin and switch to Augmentin for 10 days. Add Mucinex and drink fluids. Recheck prn  Alysia Penna, MD

## 2016-10-05 NOTE — Patient Instructions (Signed)
WE NOW OFFER   [facility] FAST TRACK!!!  SAME DAY Appointments for ACUTE CARE  Such as: Sprains, Injuries, cuts, abrasions, rashes, muscle pain, joint pain, back pain Colds, flu, sore throats, headache, allergies, cough, fever  Ear pain, sinus and eye infections Abdominal pain, nausea, vomiting, diarrhea, upset stomach Animal/insect bites  3 Easy Ways to Schedule: Walk-In Scheduling Call in scheduling Mychart Sign-up: [email or web address]         

## 2016-11-05 ENCOUNTER — Encounter: Payer: Self-pay | Admitting: Family Medicine

## 2016-11-05 ENCOUNTER — Ambulatory Visit (INDEPENDENT_AMBULATORY_CARE_PROVIDER_SITE_OTHER): Payer: 59 | Admitting: Family Medicine

## 2016-11-05 VITALS — BP 142/82 | HR 74 | Temp 98.0°F | Wt 151.8 lb

## 2016-11-05 DIAGNOSIS — J301 Allergic rhinitis due to pollen: Secondary | ICD-10-CM

## 2016-11-05 NOTE — Patient Instructions (Signed)
WE NOW OFFER   [facility] FAST TRACK!!!  SAME DAY Appointments for ACUTE CARE  Such as: Sprains, Injuries, cuts, abrasions, rashes, muscle pain, joint pain, back pain Colds, flu, sore throats, headache, allergies, cough, fever  Ear pain, sinus and eye infections Abdominal pain, nausea, vomiting, diarrhea, upset stomach Animal/insect bites  3 Easy Ways to Schedule: Walk-In Scheduling Call in scheduling Mychart Sign-up: [email or web address]         

## 2016-11-05 NOTE — Progress Notes (Signed)
   Subjective:    Patient ID: RAYLEY GAO, female    DOB: Sep 17, 1960, 56 y.o.   MRN: [id number]  HPI Here for intermittent PND and mild ST. She also feels the need to clear her throat often. This started about one week ago. No fever or cough. She was here one month ago for a sinus infection and this cleared up with a course of Augmentin.   Review of Systems  Constitutional: Negative.   HENT: Positive for postnasal drip and sore throat. Negative for congestion, ear pain, sinus pain and sinus pressure.   Eyes: Negative.   Respiratory: Negative.        Objective:   Physical Exam  Constitutional: She appears well-developed and well-nourished.  HENT:  Right Ear: External ear normal.  Left Ear: External ear normal.  Nose: Nose normal.  Mouth/Throat: Oropharynx is clear and moist.  Eyes: Conjunctivae are normal.  Neck: No thyromegaly present.  Pulmonary/Chest: Effort normal and breath sounds normal. No respiratory distress. She has no wheezes. She has no rales.  Lymphadenopathy:    She has no cervical adenopathy.          Assessment & Plan:  This sounds like seasonal allergies. Try Claritin and Flonase daily.  Alysia Penna, MD

## 2016-11-05 NOTE — Progress Notes (Signed)
Pre visit review using our clinic review tool, if applicable. No additional management support is needed unless otherwise documented below in the visit note. 

## 2017-01-14 ENCOUNTER — Ambulatory Visit (INDEPENDENT_AMBULATORY_CARE_PROVIDER_SITE_OTHER): Payer: 59 | Admitting: Family Medicine

## 2017-01-14 ENCOUNTER — Encounter: Payer: Self-pay | Admitting: Family Medicine

## 2017-01-14 VITALS — BP 118/82 | HR 76 | Temp 99.1°F | Wt 148.4 lb

## 2017-01-14 DIAGNOSIS — A09 Infectious gastroenteritis and colitis, unspecified: Secondary | ICD-10-CM

## 2017-01-14 MED ORDER — AZITHROMYCIN 500 MG PO TABS: 500.0000 mg | ORAL_TABLET | Freq: Every day | ORAL | 0 refills | Status: DC

## 2017-01-14 NOTE — Progress Notes (Signed)
Subjective:    Patient ID: Penny Barnes, female    DOB: 1960/07/28, 56 y.o.   MRN: [id number]  HPI  Diarrhea: Patient complains of diarrhea.  Onset of diarrhea was 9 days ago. This is a new problem. Diarrhea is occurring approximately 3 times per day in a 24 hour period but has improved from 4 to 5 times/day that was present during first "few" days Patient describes diarrhea as loose and watery at times, some formed stools are present  Diarrhea has been associated with mild nausea and cramping that improves after BM Patient denies fever, chills, sweats, bloody or mucous stool Previous visits for diarrhea: No Evaluation to date: None Treatment at home: Immodium has provided limited benefit Recent trigger:  Trip to [place] with eating salads and fruits; symptoms started 2 days after returning home. History of colon cancer 3 years ago; she is UTD for follow up care and reports doing well.  Past Medical History:  Diagnosis Date  . Colon cancer ([place])   . PONV (postoperative nausea and vomiting)   . Vitiligo      Social History   Social History  . Marital status: Married    Spouse name: N/A  . Number of children: 1  . Years of education: N/A   Occupational History  . 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 Smoking status: Never Smoker  . Smokeless tobacco: Never Used  . Alcohol use 0.0 oz/week     Comment: social  . Drug use: No  . Sexual activity: Not on file   Other Topics Concern  . Not on file   Social History Narrative  . No narrative on file    Past Surgical History:  Procedure Laterality Date  . ABDOMINAL HYSTERECTOMY     have one ovary  . CHOLECYSTECTOMY  09/2015  . COLON SURGERY    . LAPAROSCOPIC SIGMOID COLECTOMY N/A 08/25/2013   Procedure: LAPAROSCOPIC SIGMOID COLECTOMY;  Surgeon: Edward Jolly, MD;  Location: [facility];  Service: General;  Laterality: N/A;  . PORTACATH PLACEMENT Left 09/18/2013   Procedure: INSERTION  PORT-A-CATH;  Surgeon: Edward Jolly, MD;  Location: [facility];  Service: General;  Laterality: Left;    Family History  Problem Relation Age of Onset  . Diverticulosis Mother   . Kidney Stones Mother   . Thyroid disease Mother   . Diabetes Father   . Heart disease Father   . Cancer Sister        thyroid  . Cancer Maternal Grandmother        breast  . Colon cancer Neg Hx   . Rectal cancer Neg Hx   . Stomach cancer Neg Hx     Allergies  Allergen Reactions  . Aspirin Hives    No current outpatient prescriptions on file prior to visit.   Current Facility-Administered Medications on File Prior to Visit  Medication Dose Route Frequency Provider Last Rate Last Dose  . 0.9 %  sodium chloride infusion  500 mL Intravenous Continuous Ladene Artist, MD        BP 118/82 (BP Location: Left Arm, Patient Position: Sitting, Cuff Size: Normal)   Pulse 76   Temp 99.1 F (37.3 C) (Oral)   Wt 148 lb 6.4 oz (67.3 kg)   SpO2 98%   BMI 26.29 kg/m      Review of Systems  Constitutional: Negative for chills, diaphoresis, fatigue and fever.  Respiratory: Negative for cough, shortness of breath  and wheezing.   Cardiovascular: Negative for chest pain and palpitations.  Gastrointestinal: Positive for diarrhea and nausea. Negative for abdominal pain, blood in stool and vomiting.  Genitourinary: Negative for difficulty urinating, dysuria, flank pain, frequency, hematuria and urgency.  Musculoskeletal: Negative for back pain and myalgias.  Neurological: Negative for dizziness, light-headedness, numbness and headaches.   Past Medical History:  Diagnosis Date  . Colon cancer ([facility])   . PONV (postoperative nausea and vomiting)   . Vitiligo      Social History   Social History  . Marital status: Married    Spouse name: N/A  . Number of children: 1  . Years of education: N/A   Occupational History  . 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 Smoking  status: Never Smoker  . Smokeless tobacco: Never Used  . Alcohol use 0.0 oz/week     Comment: social  . Drug use: No  . Sexual activity: Not on file   Other Topics Concern  . Not on file   Social History Narrative  . No narrative on file    Past Surgical History:  Procedure Laterality Date  . ABDOMINAL HYSTERECTOMY     have one ovary  . CHOLECYSTECTOMY  09/2015  . COLON SURGERY    . LAPAROSCOPIC SIGMOID COLECTOMY N/A 08/25/2013   Procedure: LAPAROSCOPIC SIGMOID COLECTOMY;  Surgeon: Edward Jolly, MD;  Location: [facility];  Service: General;  Laterality: N/A;  . PORTACATH PLACEMENT Left 09/18/2013   Procedure: INSERTION PORT-A-CATH;  Surgeon: Edward Jolly, MD;  Location: [facility];  Service: General;  Laterality: Left;    Family History  Problem Relation Age of Onset  . Diverticulosis Mother   . Kidney Stones Mother   . Thyroid disease Mother   . Diabetes Father   . Heart disease Father   . Cancer Sister        thyroid  . Cancer Maternal Grandmother        breast  . Colon cancer Neg Hx   . Rectal cancer Neg Hx   . Stomach cancer Neg Hx     Allergies  Allergen Reactions  . Aspirin Hives    No current outpatient prescriptions on file prior to visit.   Current Facility-Administered Medications on File Prior to Visit  Medication Dose Route Frequency Provider Last Rate Last Dose  . 0.9 %  sodium chloride infusion  500 mL Intravenous Continuous Ladene Artist, MD        BP 118/82 (BP Location: Left Arm, Patient Position: Sitting, Cuff Size: Normal)   Pulse 76   Temp 99.1 F (37.3 C) (Oral)   Wt 148 lb 6.4 oz (67.3 kg)   SpO2 98%   BMI 26.29 kg/m        Objective:   Physical Exam  Constitutional: She is oriented to person, place, and time. She appears well-developed and well-nourished.  Eyes: Pupils are equal, round, and reactive to light. No scleral icterus.  Neck: Neck supple.  Cardiovascular: Normal rate and regular rhythm.   Pulmonary/Chest:  Effort normal. She has no wheezes. She has no rales.  Abdominal: Soft. She exhibits no distension. There is no tenderness. There is no rebound and no guarding.  Active bowel sounds  Musculoskeletal: She exhibits no edema.  Lymphadenopathy:    She has no cervical adenopathy.  Neurological: She is alert and oriented to person, place, and time. Coordination normal.  Skin: Skin is warm and dry. No rash noted.  Psychiatric: She has a normal mood and affect. Her behavior is normal. Judgment and thought content normal.          Assessment & Plan:  1. Traveler's diarrhea Recent trip to [place] and eating salads; suspect traveler's diarrhea; will treat with azithromycin and advised her to follow up if symptoms do not improve, worsen,or she develops a fever >100 or abdominal pain. If symptoms are not improving can obtain stool culture. Also advised her that she can use OTC diarrhea medication with antibiotic for symptom relief.  - azithromycin (ZITHROMAX) 500 MG tablet; Take 1 tablet (500 mg total) by mouth daily.  Dispense: 3 tablet; Refill: 0  Delano Metz, FNP-C

## 2017-01-14 NOTE — Patient Instructions (Signed)
Diarrhea, Adult °Diarrhea is when you have loose and water poop (stool) often. Diarrhea can make you feel weak and cause you to get dehydrated. Dehydration can make you tired and thirsty, make you have a dry mouth, and make it so you pee (urinate) less often. Diarrhea often lasts 2-3 days. However, it can last longer if it is a sign of something more serious. It is important to treat your diarrhea as told by your doctor. °Follow these instructions at home: °Eating and drinking ° °Follow these recommendations as told by your doctor: °· Take an oral rehydration solution (ORS). This is a drink that is sold at pharmacies and stores. °· Drink clear fluids, such as: °? Water. °? Ice chips. °? Diluted fruit juice. °? Low-calorie sports drinks. °· Eat bland, easy-to-digest foods in small amounts as you are able. These foods include: °? Bananas. °? Applesauce. °? Rice. °? Low-fat (lean) meats. °? Toast. °? Crackers. °· Avoid drinking fluids that have a lot of sugar or caffeine in them. °· Avoid alcohol. °· Avoid spicy or fatty foods. ° °General instructions ° °· Drink enough fluid to keep your pee (urine) clear or pale yellow. °· Wash your hands often. If you cannot use soap and water, use hand sanitizer. °· Make sure that all people in your home wash their hands well and often. °· Take over-the-counter and prescription medicines only as told by your doctor. °· Rest at home while you get better. °· Watch your condition for any changes. °· Take a warm bath to help with any burning or pain from having diarrhea. °· Keep all follow-up visits as told by your doctor. This is important. °Contact a doctor if: °· You have a fever. °· Your diarrhea gets worse. °· You have new symptoms. °· You cannot keep fluids down. °· You feel light-headed or dizzy. °· You have a headache. °· You have muscle cramps. °Get help right away if: °· You have chest pain. °· You feel very weak or you pass out (faint). °· You have bloody or black poop or  poop that look like tar. °· You have very bad pain, cramping, or bloating in your belly (abdomen). °· You have trouble breathing or you are breathing very quickly. °· Your heart is beating very quickly. °· Your skin feels cold and clammy. °· You feel confused. °· You have signs of dehydration, such as: °? Dark pee, hardly any pee, or no pee. °? Cracked lips. °? Dry mouth. °? Sunken eyes. °? Sleepiness. °? Weakness. °This information is not intended to replace advice given to you by your health care provider. Make sure you discuss any questions you have with your health care provider. °Document Released: 12/19/2007 Document Revised: 01/20/2016 Document Reviewed: 03/08/2015 °Elsevier Interactive Patient Education © 2018 [employer] ° °

## 2017-02-15 ENCOUNTER — Other Ambulatory Visit: Payer: 59

## 2017-02-18 ENCOUNTER — Telehealth: Payer: Self-pay | Admitting: Oncology

## 2017-02-18 ENCOUNTER — Ambulatory Visit (HOSPITAL_BASED_OUTPATIENT_CLINIC_OR_DEPARTMENT_OTHER): Payer: 59 | Admitting: Oncology

## 2017-02-18 ENCOUNTER — Ambulatory Visit: Payer: 59

## 2017-02-18 VITALS — BP 133/82 | HR 75 | Temp 98.7°F | Resp 18 | Ht 63.0 in | Wt 147.2 lb

## 2017-02-18 DIAGNOSIS — G620 Drug-induced polyneuropathy: Secondary | ICD-10-CM

## 2017-02-18 DIAGNOSIS — C187 Malignant neoplasm of sigmoid colon: Secondary | ICD-10-CM

## 2017-02-18 DIAGNOSIS — K811 Chronic cholecystitis: Secondary | ICD-10-CM

## 2017-02-18 DIAGNOSIS — Z85038 Personal history of other malignant neoplasm of large intestine: Secondary | ICD-10-CM | POA: Diagnosis not present

## 2017-02-18 LAB — CEA (IN HOUSE-CHCC)

## 2017-02-18 NOTE — Progress Notes (Signed)
  [place] OFFICE PROGRESS NOTE   Diagnosis: Colon cancer  INTERVAL HISTORY: Penny Barnes returns as scheduled. She feels well. Good appetite. She had a bout of diarrhea following a trip to [place]. No bleeding. Minimal remaining neuropathy symptoms that does not interfere with activity.    Objective:  Vital signs in last 24 hours:  Blood pressure 133/82, pulse 75, temperature 98.7 F (37.1 C), temperature source Oral, resp. rate 18, height '5\' 3"'$  (1.6 m), weight 147 lb 3.2 oz (66.8 kg), SpO2 100 %.    HEENT: Neck without mass  Lymphatics: No cervical, supraclavicular, axillary, or inguinal nodes  Resp: Lungs clear bilaterally Cardiac: Regular rate and rhythm GI: No hepatomegaly, no mass, nontender Vascular: No leg edema   Lab Results:    Lab Results  Component Value Date   CEA1 <1.00 08/15/2016   CEA1 0.9 08/15/2016    Medications: I have reviewed the patient's current medications.  Assessment/Plan: 1. 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  Cycle 1 adjuvant CAPOX 09/21/2013.   Cycle 2 adjuvant CAPOX 10/12/2013.   Cycle 3 adjuvant CAPOX 11/02/2013.   Cycle 4 adjuvant CAPOX 11/23/2013.   Cycle 5 adjuvant CAPOX 12/14/2013.   Cycle 6 adjuvant CAPOX 01/04/2014   Cycle 7 adjuvant CAPOX 01/25/2014.  Cycle 8 adjuvant CAPOX 02/15/2014.  Surveillance CT scans 07/26/2014-negative for recurrent disease.  Negative colonoscopy 08/05/2014. Next colonoscopy at a 2 year interval.  Surveillance CT scans 08/12/2015-negative for recurrent disease  Surveillance colonoscopy 08/06/2016-negative  Surveillance CT scans 08/15/2016-negative for metastatic disease 2. History of delayed nausea following cycle 1 CAPOX. Aloxi added with cycle 2. Emend and prophylactic  Decadron added with cycle 3. 3. History of hand-foot syndrome secondary to Xeloda.  4. Diarrhea cycle 3 week 2. Xeloda dose reduced beginning with cycle 4. 5. Oxaliplatin neuropathy, Persistent mild neuropathy symptoms. 6. Possible polyp in the gallbladder on the surveillance CT scan 08/12/2015.Abdominal ultrasound 09/01/2015 with several gallbladder wall polyps with the largest measuring 6.5 mm.Status post cholecystectomy 09/20/2015. Pathology showed chronic cholecystitis. No evidence of malignancy.   Disposition:  She remains in clinical remission from colon cancer. We will follow-up on the CEA from today. Penny Barnes will return for an office visit and CEA in 6 months.  15 minutes were spent with the patient today. The majority of the time was used for counseling and coordination of care.  Donneta Romberg, MD  02/18/2017  8:56 AM

## 2017-02-18 NOTE — Telephone Encounter (Signed)
Scheduled appt per 8/6 los - Gave patient AVS and calender per los.  

## 2017-02-19 ENCOUNTER — Ambulatory Visit: Payer: 59 | Admitting: Oncology

## 2017-02-19 ENCOUNTER — Other Ambulatory Visit: Payer: 59

## 2017-02-19 ENCOUNTER — Telehealth: Payer: Self-pay | Admitting: *Deleted

## 2017-02-19 LAB — CEA: CEA: 1.3 ng/mL (ref 0.0–4.7)

## 2017-02-19 NOTE — Telephone Encounter (Signed)
-----   Message from Ladell Pier, MD sent at 02/19/2017  7:20 AM EDT ----- Please call patient, cea is normal

## 2017-04-10 DIAGNOSIS — R319 Hematuria, unspecified: Secondary | ICD-10-CM | POA: Diagnosis not present

## 2017-04-10 DIAGNOSIS — N390 Urinary tract infection, site not specified: Secondary | ICD-10-CM | POA: Diagnosis not present

## 2017-08-21 ENCOUNTER — Telehealth: Payer: Self-pay | Admitting: Oncology

## 2017-08-21 ENCOUNTER — Inpatient Hospital Stay: Payer: 59 | Attending: Nurse Practitioner | Admitting: Nurse Practitioner

## 2017-08-21 ENCOUNTER — Inpatient Hospital Stay: Payer: 59

## 2017-08-21 ENCOUNTER — Encounter: Payer: Self-pay | Admitting: Nurse Practitioner

## 2017-08-21 VITALS — BP 147/85 | HR 81 | Temp 98.6°F | Resp 18 | Ht 63.0 in | Wt 145.6 lb

## 2017-08-21 DIAGNOSIS — G629 Polyneuropathy, unspecified: Secondary | ICD-10-CM

## 2017-08-21 DIAGNOSIS — C187 Malignant neoplasm of sigmoid colon: Secondary | ICD-10-CM | POA: Diagnosis not present

## 2017-08-21 LAB — CEA (IN HOUSE-CHCC)

## 2017-08-21 NOTE — Progress Notes (Signed)
  [place] OFFICE PROGRESS NOTE   Diagnosis: Colon cancer  INTERVAL HISTORY:   Penny Barnes returns as scheduled.  No change in bowel habits.  No bloody or black stools.  No abdominal pain.  She has a good appetite.  She occasionally notes mild tingling in the toes.  No persistent neuropathy symptoms.  Over the past 9-12 months she has noted that her voice changes at times, especially when singing at church.  She does not describe this as hoarseness.  Objective:  Vital signs in last 24 hours:  Blood pressure (!) 147/85, pulse 81, temperature 98.6 F (37 C), temperature source Oral, resp. rate 18, height _0  (1.6 m), weight 145 lb 9.6 oz (66 kg), SpO2 98 %.    HEENT: Neck without mass. Lymphatics: No palpable cervical, supraclavicular, axillary or inguinal lymph nodes. Resp: Lungs clear bilaterally. Cardio: Regular rate and rhythm. GI: Abdomen soft and nontender.  No hepatomegaly. Vascular: No leg edema.   Lab Results:  Lab Results  Component Value Date   WBC 3.0 (L) 07/26/2014   HGB 13.9 07/26/2014   HCT 41.6 07/26/2014   MCV 89.1 07/26/2014   PLT 204 07/26/2014   NEUTROABS 1.7 07/26/2014    Imaging:  No results found.  Medications: I have reviewed the patient's current medications.  Assessment/Plan: 1. 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  Cycle 1 adjuvant CAPOX 09/21/2013.   Cycle 2 adjuvant CAPOX 10/12/2013.   Cycle 3 adjuvant CAPOX 11/02/2013.   Cycle 4 adjuvant CAPOX 11/23/2013.   Cycle 5 adjuvant CAPOX 12/14/2013.   Cycle 6 adjuvant CAPOX 01/04/2014   Cycle 7 adjuvant CAPOX 01/25/2014.  Cycle 8 adjuvant CAPOX 02/15/2014.  Surveillance CT scans 07/26/2014-negative for recurrent disease.  Negative colonoscopy 08/05/2014. Next  colonoscopy at a 2 year interval.  Surveillance CT scans 08/12/2015-negative for recurrent disease  Surveillance colonoscopy 08/06/2016-negative  Surveillance CT scans 08/15/2016-negative for metastatic disease 2. History of delayed nausea following cycle 1 CAPOX. Aloxi added with cycle 2. Emend and prophylactic Decadron added with cycle 3. 3. History of hand-foot syndrome secondary to Xeloda.  4. Diarrhea cycle 3 week 2. Xeloda dose reduced beginning with cycle 4. 5. Oxaliplatin neuropathy, persistent mild neuropathy symptoms. 6. Possible polyp in the gallbladder on the surveillance CT scan 08/12/2015.Abdominal ultrasound 09/01/2015 with several gallbladder wall polyps with the largest measuring 6.5 mm.Status post cholecystectomy 09/20/2015. Pathology showed chronic cholecystitis. No evidence of malignancy.    Disposition: Penny Barnes remains in clinical remission from colon cancer.  We will follow-up on the CEA from today.  She will return for a follow-up visit and CEA in 6 months.  She will follow-up with Dr. Sarajane Jews or ENT if she continues to note the voice change.    Ned Card ANP/GNP-BC   08/21/2017  9:25 AM

## 2017-08-21 NOTE — Telephone Encounter (Signed)
Gave avs and calendar for august  °

## 2017-08-22 ENCOUNTER — Telehealth: Payer: Self-pay | Admitting: Emergency Medicine

## 2017-08-22 NOTE — Telephone Encounter (Addendum)
Left VM to call back regarding this note.   ----- Message from Owens Shark, NP sent at 08/21/2017  3:45 PM EST ----- Please let her know the CEA is normal.  Follow-up as scheduled.

## 2017-08-22 NOTE — Telephone Encounter (Signed)
Pt verbalized understanding of CEA being normal.

## 2017-12-08 IMAGING — CT CT ABD-PELV W/ CM
2 of 5 series · 15 of 46 positions shown, 17 images · IV contrast (OMNIPAQUE 300)
Comparison: Multiple exams, including 07/26/2014 and 07/27/2013

CLINICAL DATA: Restaging colon cancer. Colon resection.
Chemotherapy completed.

EXAM:
CT CHEST, ABDOMEN, AND PELVIS WITH CONTRAST
TECHNIQUE: Multidetector CT imaging of the chest, abdomen and pelvis was
performed following the standard protocol during bolus
administration of intravenous contrast.
CONTRAST:  100mL OMNIPAQUE IOHEXOL 300 MG/ML  SOLN

[Series 2: c/a/p · axial · 0.71mm/px · z∈[+1061,+1596]mm · 12 of 123 slices shown, 14 images]
[im 8/123  soft-tissue]
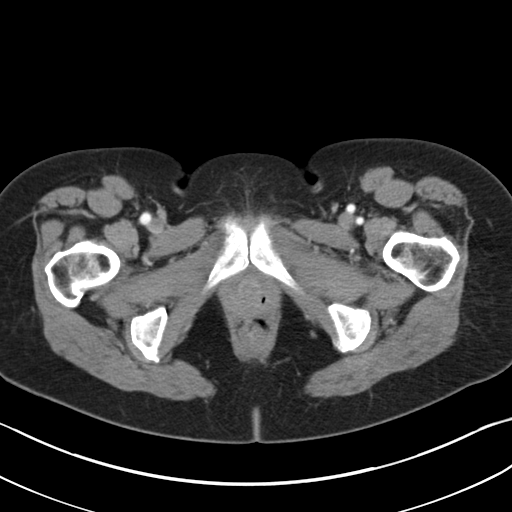
[im 8/123  bone]
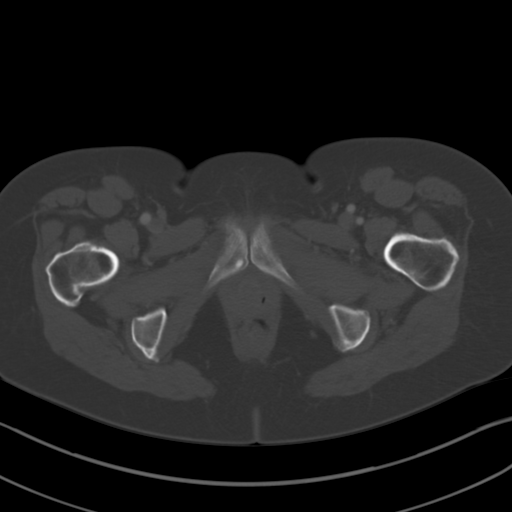
[im 22/123  soft-tissue]
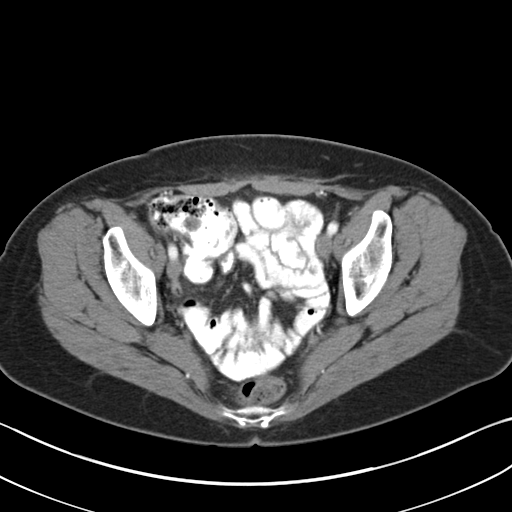
[im 29/123  soft-tissue]
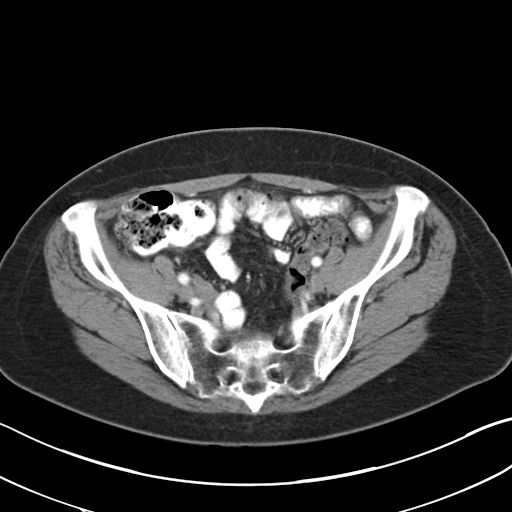
[im 36/123  soft-tissue]
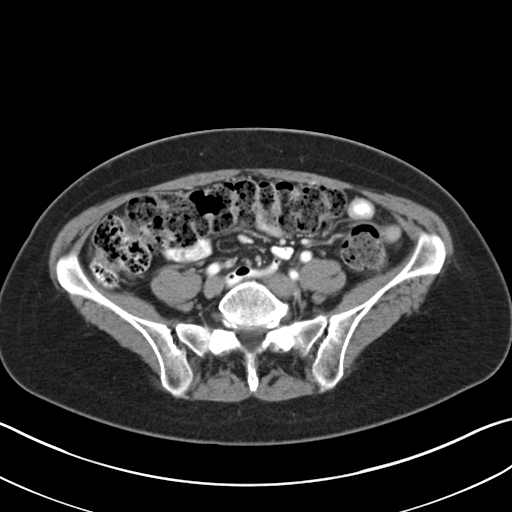
[im 51/123  soft-tissue]
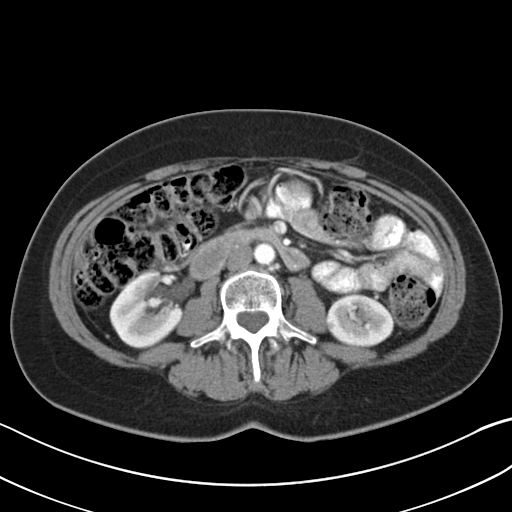
[im 58/123  soft-tissue]
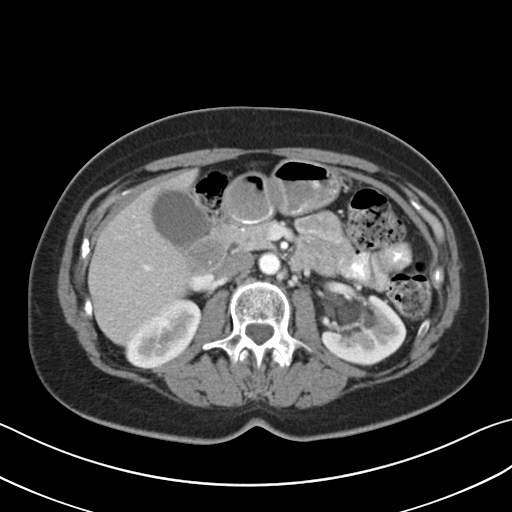
[im 65/123  soft-tissue]
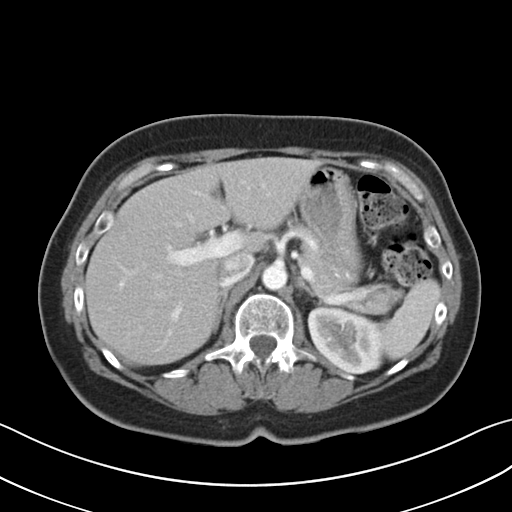
[im 79/123  soft-tissue]
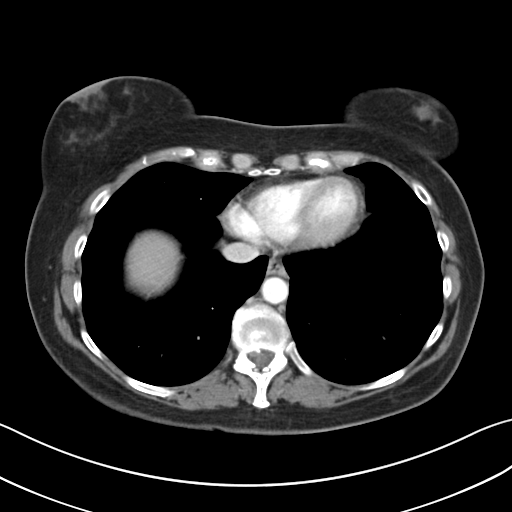
[im 87/123  soft-tissue]
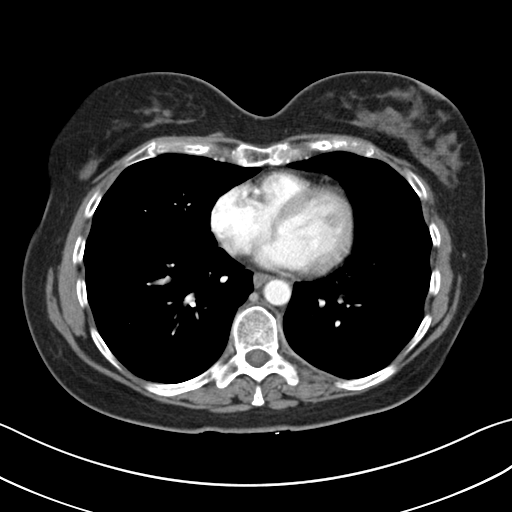
[im 87/123  bone]
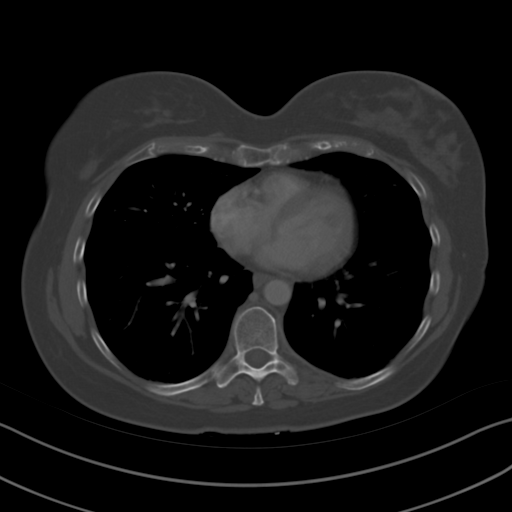
[im 94/123  soft-tissue]
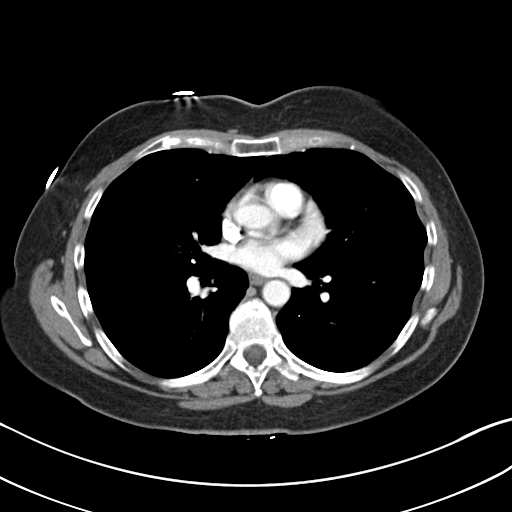
[im 108/123  soft-tissue]
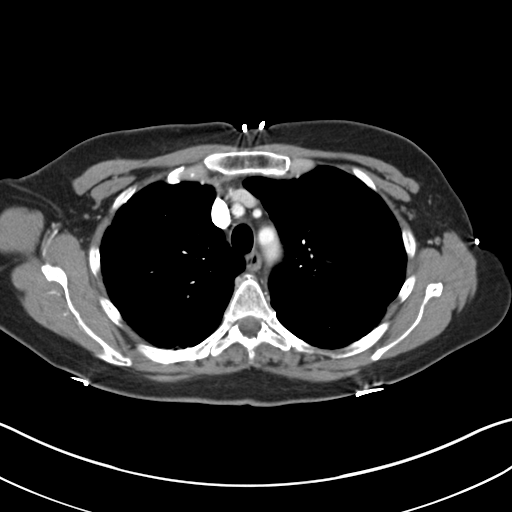
[im 115/123  soft-tissue]
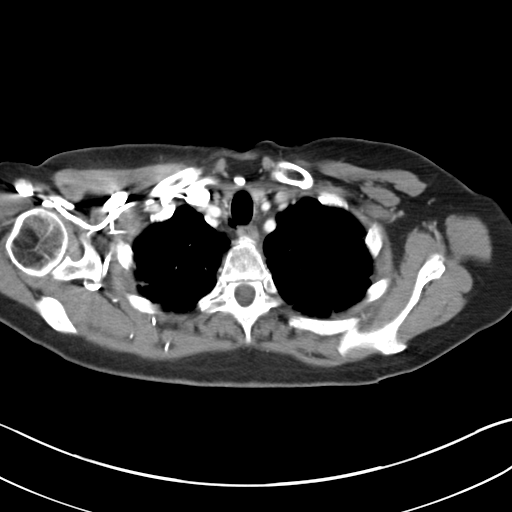

[Series 3: coronal a/|p · coronal · 0.70mm/px · 3 of 83 slices shown]
[im 28/83  soft-tissue]
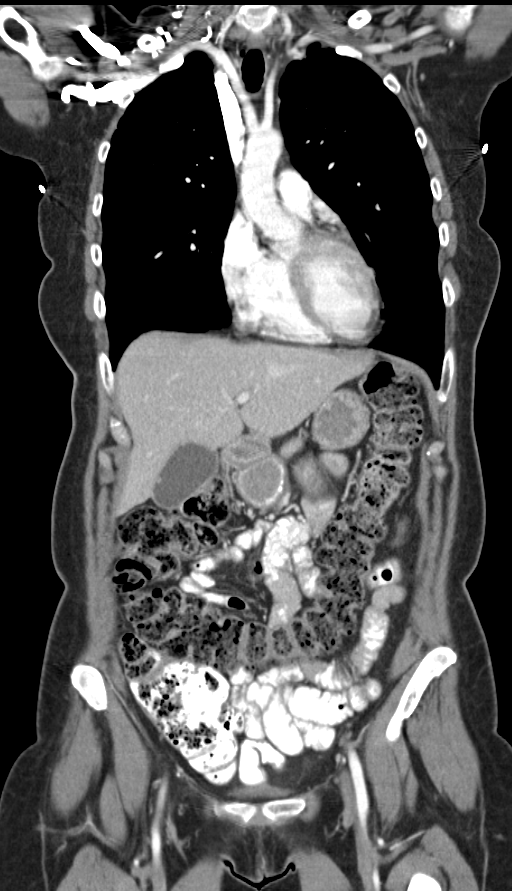
[im 37/83  soft-tissue]
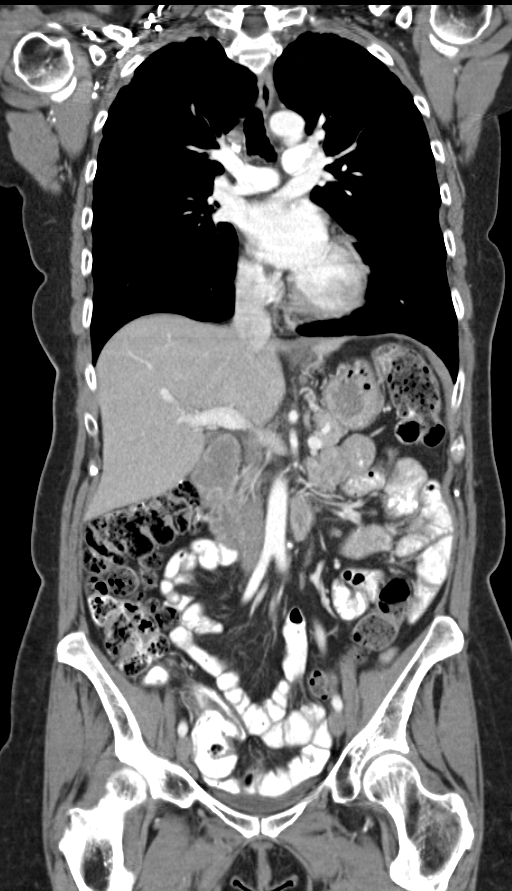
[im 46/83  soft-tissue]
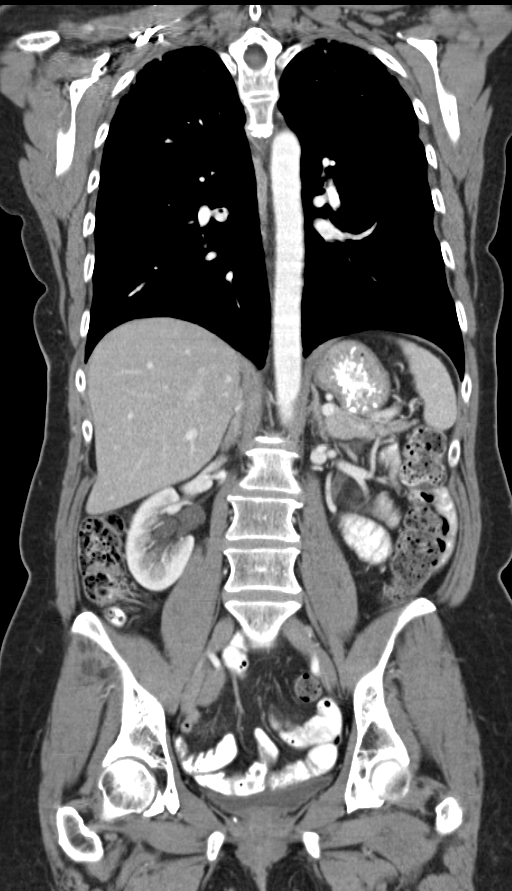

[15 of 46 positions shown; findings below may reference images not displayed]

FINDINGS: CT CHEST FINDINGS

Mediastinum/Nodes: Small calcified subcarinal lymph node. Small
upper tracheal diverticulum. Otherwise unremarkable.

Lungs/Pleura: Biapical pleural parenchymal scarring. No findings of
metastatic disease to the lungs.

Musculoskeletal: Mild thoracic spondylosis.

CT ABDOMEN PELVIS FINDINGS

Hepatobiliary: Unusual appearance of the gallbladder fundus possibly
due to a Phrygian cap or a small polyp/ nodule, not appreciably
changed from prior. I suspect a separate 4 mm gallbladder polyp in
the gallbladder on image 36 series 4. No findings of metastatic
disease to the liver.

Pancreas: Unremarkable

Spleen: Unremarkable

Adrenals/Urinary Tract: Left renal peripelvic cysts. Adrenal glands
normal.

Stomach/Bowel: Prominent stool throughout the colon favors
constipation. Rectosigmoid anastomosis without complicating feature.

Vascular/Lymphatic: Unremarkable

Reproductive: Uterus absent. Right ovary unremarkable. Left ovary
not well seen.

Other: No supplemental non-categorized findings.

Musculoskeletal: Unremarkable
IMPRESSION: 1. No findings of recurrent malignancy.
2. Unusual appearance the gallbladder fundus, possibly due to a
Phrygian cap or small polyp/nodule, not changed from prior. I
suspect a separate 4 mm gallbladder polyp. Consider gallbladder
ultrasound for further workup.
3.  Prominent stool throughout the colon favors constipation.

## 2018-02-04 ENCOUNTER — Telehealth: Payer: Self-pay

## 2018-02-04 NOTE — Telephone Encounter (Signed)
Patient called and r/s her upcoming appointment due to she will be out of town the week of the 5th. Per 7/23 phone que

## 2018-02-17 ENCOUNTER — Telehealth: Payer: Self-pay | Admitting: Nurse Practitioner

## 2018-02-17 NOTE — Telephone Encounter (Signed)
Tried to call patient regarding voicemail and mailbox was full

## 2018-02-18 ENCOUNTER — Other Ambulatory Visit: Payer: 59

## 2018-02-18 ENCOUNTER — Ambulatory Visit: Payer: 59 | Admitting: Oncology

## 2018-02-20 ENCOUNTER — Telehealth: Payer: Self-pay

## 2018-02-20 NOTE — Telephone Encounter (Signed)
Patient called to r/s her appointments because she will be out of town. Per 8/8 phone que Mailed a letter with calender enclosed of this new appointment.

## 2018-02-24 ENCOUNTER — Inpatient Hospital Stay: Payer: 59

## 2018-02-24 ENCOUNTER — Inpatient Hospital Stay: Payer: 59 | Admitting: Nurse Practitioner

## 2018-03-18 ENCOUNTER — Encounter: Payer: Self-pay | Admitting: Nurse Practitioner

## 2018-03-18 ENCOUNTER — Inpatient Hospital Stay: Payer: 59 | Attending: Oncology | Admitting: Nurse Practitioner

## 2018-03-18 ENCOUNTER — Telehealth: Payer: Self-pay | Admitting: Oncology

## 2018-03-18 ENCOUNTER — Inpatient Hospital Stay: Payer: 59

## 2018-03-18 VITALS — BP 143/72 | HR 73 | Temp 98.4°F | Resp 18 | Ht 63.0 in | Wt 149.4 lb

## 2018-03-18 DIAGNOSIS — C187 Malignant neoplasm of sigmoid colon: Secondary | ICD-10-CM

## 2018-03-18 DIAGNOSIS — Z85038 Personal history of other malignant neoplasm of large intestine: Secondary | ICD-10-CM

## 2018-03-18 DIAGNOSIS — Z9221 Personal history of antineoplastic chemotherapy: Secondary | ICD-10-CM | POA: Diagnosis not present

## 2018-03-18 DIAGNOSIS — G620 Drug-induced polyneuropathy: Secondary | ICD-10-CM | POA: Insufficient documentation

## 2018-03-18 LAB — CEA (IN HOUSE-CHCC): CEA (CHCC-In House): <1 ng/mL (ref 0.00–5.00)

## 2018-03-18 NOTE — Progress Notes (Addendum)
  [place] OFFICE PROGRESS NOTE   Diagnosis: Colon cancer  INTERVAL HISTORY:   Penny Barnes returns as scheduled.  She feels well.  No change in bowel habits.  No bloody or black stools.  No abdominal pain.  She has a good appetite.  Objective:  Vital signs in last 24 hours:  Blood pressure (!) 143/72, pulse 73, temperature 98.4 F (36.9 C), temperature source Oral, resp. rate 18, height _0  (1.6 m), weight 149 lb 6.4 oz (67.8 kg), SpO2 100 %.    HEENT: Neck without mass. Lymphatics: No palpable cervical, supraclavicular, axillary or inguinal lymph nodes. Resp: Lungs clear bilaterally. Cardio: Regular rate and rhythm. GI: Abdomen soft and nontender.  No hepatomegaly. Vascular: No leg edema.   Lab Results:  Lab Results  Component Value Date   WBC 3.0 (L) 07/26/2014   HGB 13.9 07/26/2014   HCT 41.6 07/26/2014   MCV 89.1 07/26/2014   PLT 204 07/26/2014   NEUTROABS 1.7 07/26/2014    Imaging:  No results found.  Medications: I have reviewed the patient's current medications.  Assessment/Plan: 1. 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  Cycle 1 adjuvant CAPOX 09/21/2013.   Cycle 2 adjuvant CAPOX 10/12/2013.   Cycle 3 adjuvant CAPOX 11/02/2013.   Cycle 4 adjuvant CAPOX 11/23/2013.   Cycle 5 adjuvant CAPOX 12/14/2013.   Cycle 6 adjuvant CAPOX 01/04/2014   Cycle 7 adjuvant CAPOX 01/25/2014.  Cycle 8 adjuvant CAPOX 02/15/2014.  Surveillance CT scans 07/26/2014-negative for recurrent disease.  Negative colonoscopy 08/05/2014. Next colonoscopy at a 2 year interval.  Surveillance CT scans 08/12/2015-negative for recurrent disease  Surveillance colonoscopy 08/06/2016-negative  Surveillance CT scans 08/15/2016-negative for metastatic  disease 2. History of delayed nausea following cycle 1 CAPOX. Aloxi added with cycle 2. Emend and prophylactic Decadron added with cycle 3. 3. History of hand-foot syndrome secondary to Xeloda.  4. Diarrhea cycle 3 week 2. Xeloda dose reduced beginning with cycle 4. 5. Oxaliplatin neuropathy, persistent mild neuropathy symptoms. 6. Possible polyp in the gallbladder on the surveillance CT scan 08/12/2015.Abdominal ultrasound 09/01/2015 with several gallbladder wall polyps with the largest measuring 6.5 mm.Status post cholecystectomy 09/20/2015. Pathology showed chronic cholecystitis. No evidence of malignancy.   Disposition: Penny Barnes remains in clinical remission from colon cancer.  We will follow-up on the CEA from today.  She will return for a follow-up visit and CEA in 6 months.  She will contact the office in the interim with any problems.  Patient seen with Dr. Benay Spice.    Ned Card ANP/GNP-BC   03/18/2018  1:59 PM This was a shared visit with Ned Card.  Penny Barnes is remission 4-1/2 years out from a diagnosis of stage III colon cancer.  She will return for an office visit and CEA in 6 months.  Julieanne Manson, MD

## 2018-03-18 NOTE — Telephone Encounter (Signed)
Appts scheduled AVS/ Calendar printed per 9/3 los °

## 2018-03-19 ENCOUNTER — Telehealth: Payer: Self-pay | Admitting: Emergency Medicine

## 2018-03-19 NOTE — Telephone Encounter (Addendum)
Pt verbalized understanding of this note   ----- Message from Owens Shark, NP sent at 03/18/2018  4:30 PM EDT ----- Please let her know the CEA is in normal range.  Follow-up as scheduled.

## 2018-09-16 ENCOUNTER — Other Ambulatory Visit: Payer: 59

## 2018-09-16 ENCOUNTER — Ambulatory Visit: Payer: 59 | Admitting: Oncology

## 2018-09-22 ENCOUNTER — Telehealth: Payer: Self-pay | Admitting: *Deleted

## 2018-09-22 ENCOUNTER — Inpatient Hospital Stay: Payer: BLUE CROSS/BLUE SHIELD | Attending: Oncology

## 2018-09-22 ENCOUNTER — Telehealth: Payer: Self-pay | Admitting: Oncology

## 2018-09-22 ENCOUNTER — Inpatient Hospital Stay: Payer: BLUE CROSS/BLUE SHIELD | Admitting: Oncology

## 2018-09-22 ENCOUNTER — Other Ambulatory Visit: Payer: Self-pay

## 2018-09-22 VITALS — BP 155/62 | HR 74 | Temp 98.2°F | Resp 18 | Wt 149.9 lb

## 2018-09-22 DIAGNOSIS — G620 Drug-induced polyneuropathy: Secondary | ICD-10-CM | POA: Insufficient documentation

## 2018-09-22 DIAGNOSIS — Z85038 Personal history of other malignant neoplasm of large intestine: Secondary | ICD-10-CM

## 2018-09-22 DIAGNOSIS — C187 Malignant neoplasm of sigmoid colon: Secondary | ICD-10-CM

## 2018-09-22 LAB — CEA (IN HOUSE-CHCC): CEA (CHCC-In House): 1.02 ng/mL (ref 0.00–5.00)

## 2018-09-22 NOTE — Telephone Encounter (Signed)
Gave patient avs report and appointments for March 2021

## 2018-09-22 NOTE — Telephone Encounter (Signed)
-----   Message from Ladell Pier, MD sent at 09/22/2018  4:52 PM EDT ----- Please call patient, CEA is normal, follow-up as scheduled

## 2018-09-22 NOTE — Patient Instructions (Signed)
Please provide a copy of your Medical Advanced Directive/Living Will to have scanned into your records

## 2018-09-22 NOTE — Telephone Encounter (Signed)
Notified of normal CEA and f/u as scheduled. 

## 2018-09-22 NOTE — Progress Notes (Signed)
  [place] OFFICE PROGRESS NOTE   Diagnosis: Colon cancer  INTERVAL HISTORY:   Penny Barnes is as scheduled.  She feels well.  Good appetite and energy level.  No difficulty with bowel function.  Objective:  Vital signs in last 24 hours:  Blood pressure (!) 155/62, pulse 74, temperature 98.2 F (36.8 C), resp. rate 18, weight 149 lb 14.4 oz (68 kg), SpO2 100 %.    HEENT: Neck without mass Lymphatics: No cervical, supraclavicular, axillary, or inguinal nodes Resp: Lungs clear bilaterally Cardio: Regular rate and rhythm GI: No hepatosplenomegaly, no mass, nontender Vascular: No leg edema   Lab Results:  Lab Results  Component Value Date   WBC 3.0 (L) 07/26/2014   HGB 13.9 07/26/2014   HCT 41.6 07/26/2014   MCV 89.1 07/26/2014   PLT 204 07/26/2014   NEUTROABS 1.7 07/26/2014    CMP  Lab Results  Component Value Date   NA 142 08/15/2016   K 4.5 08/15/2016   CL 104 09/16/2013   CO2 28 08/15/2016   GLUCOSE 95 08/15/2016   BUN 13.6 08/15/2016   CREATININE 0.7 08/15/2016   CALCIUM 9.6 08/15/2016   PROT 7.8 08/15/2016   ALBUMIN 4.2 08/15/2016   AST 17 08/15/2016   ALT 15 08/15/2016   ALKPHOS 87 08/15/2016   BILITOT 0.46 08/15/2016   GFRNONAA >90 09/16/2013   GFRAA >90 09/16/2013    Lab Results  Component Value Date   CEA1 <1.00 03/18/2018     Medications: I have reviewed the patient's current medications.   Assessment/Plan: 1. 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  Cycle 1 adjuvant CAPOX 09/21/2013.   Cycle 2 adjuvant CAPOX 10/12/2013.   Cycle 3 adjuvant CAPOX 11/02/2013.   Cycle 4 adjuvant CAPOX 11/23/2013.   Cycle 5 adjuvant CAPOX 12/14/2013.   Cycle 6 adjuvant CAPOX 01/04/2014   Cycle 7 adjuvant CAPOX 01/25/2014.  Cycle 8  adjuvant CAPOX 02/15/2014.  Surveillance CT scans 07/26/2014-negative for recurrent disease.  Negative colonoscopy 08/05/2014. Next colonoscopy at a 2 year interval.  Surveillance CT scans 08/12/2015-negative for recurrent disease  Surveillance colonoscopy 08/06/2016-negative  Surveillance CT scans 08/15/2016-negative for metastatic disease 2. History of delayed nausea following cycle 1 CAPOX. Aloxi added with cycle 2. Emend and prophylactic Decadron added with cycle 3. 3. History of hand-foot syndrome secondary to Xeloda.  4. Diarrhea cycle 3 week 2. Xeloda dose reduced beginning with cycle 4. 5. Oxaliplatin neuropathy, persistent mild neuropathy symptoms. 6. Possible polyp in the gallbladder on the surveillance CT scan 08/12/2015.Abdominal ultrasound 09/01/2015 with several gallbladder wall polyps with the largest measuring 6.5 mm.Status post cholecystectomy 09/20/2015. Pathology showed chronic cholecystitis. No evidence of malignancy.    Disposition: Penny Barnes remains in clinical remission from colon cancer.  We will follow-up on the CEA from today.  She will schedule a colonoscopy with Dr. Fuller Plan for 2021.  She would like to continue follow-up at the Cancer center.  She will return for an office visit and CEA in 1 year.  15 minutes were spent with the patient today.  The majority of the time was used for counseling and coordination of care.  Betsy Coder, MD  09/22/2018  9:33 AM

## 2019-07-21 ENCOUNTER — Other Ambulatory Visit: Payer: Self-pay

## 2019-07-21 ENCOUNTER — Encounter: Payer: Self-pay | Admitting: Gastroenterology

## 2019-07-21 ENCOUNTER — Ambulatory Visit (AMBULATORY_SURGERY_CENTER): Payer: BC Managed Care – PPO

## 2019-07-21 VITALS — Temp 97.6°F | Ht 63.0 in | Wt 151.2 lb

## 2019-07-21 DIAGNOSIS — Z85038 Personal history of other malignant neoplasm of large intestine: Secondary | ICD-10-CM

## 2019-07-21 DIAGNOSIS — C187 Malignant neoplasm of sigmoid colon: Secondary | ICD-10-CM

## 2019-07-21 DIAGNOSIS — Z1159 Encounter for screening for other viral diseases: Secondary | ICD-10-CM

## 2019-07-21 MED ORDER — NA SULFATE-K SULFATE-MG SULF 17.5-3.13-1.6 GM/177ML PO SOLN: 1.0000 | Freq: Once | ORAL | 0 refills | Status: AC

## 2019-07-21 NOTE — Progress Notes (Signed)

## 2019-07-22 ENCOUNTER — Telehealth: Payer: Self-pay | Admitting: Gastroenterology

## 2019-07-22 NOTE — Telephone Encounter (Signed)
Patient is calling about prep- said that she got a message from her pharmacy and states that she needs pre approval for prep- insurance wont cover it with out it.

## 2019-07-22 NOTE — Telephone Encounter (Signed)
Pt states suprep is cash price of 133.99--  Coupon will not cover prep - [employer]-- will not cover --will do sample Suprep LOT [id number]  EXP 09/22 Pt will pick up 3rd Floor

## 2019-07-27 ENCOUNTER — Ambulatory Visit (INDEPENDENT_AMBULATORY_CARE_PROVIDER_SITE_OTHER): Payer: BC Managed Care – PPO

## 2019-07-27 DIAGNOSIS — Z1159 Encounter for screening for other viral diseases: Secondary | ICD-10-CM

## 2019-07-27 LAB — SARS CORONAVIRUS 2 (TAT 6-24 HRS): SARS Coronavirus 2: NEGATIVE

## 2019-07-30 ENCOUNTER — Other Ambulatory Visit: Payer: Self-pay

## 2019-07-30 ENCOUNTER — Ambulatory Visit (AMBULATORY_SURGERY_CENTER): Payer: BC Managed Care – PPO | Admitting: Gastroenterology

## 2019-07-30 ENCOUNTER — Encounter: Payer: Self-pay | Admitting: Gastroenterology

## 2019-07-30 VITALS — BP 118/67 | HR 80 | Temp 98.3°F | Resp 10 | Ht 63.0 in | Wt 151.2 lb

## 2019-07-30 DIAGNOSIS — Z85038 Personal history of other malignant neoplasm of large intestine: Secondary | ICD-10-CM | POA: Diagnosis present

## 2019-07-30 HISTORY — PX: COLONOSCOPY: SHX174

## 2019-07-30 MED ORDER — SODIUM CHLORIDE 0.9 % IV SOLN: 500.0000 mL | Freq: Once | INTRAVENOUS | Status: DC

## 2019-07-30 NOTE — Progress Notes (Signed)
PT taken to PACU. Monitors in place. VSS. Report given to RN. 

## 2019-07-30 NOTE — Patient Instructions (Signed)
YOU HAD AN ENDOSCOPIC PROCEDURE TODAY AT THE [facility]:   Refer to the procedure report that was given to you for any specific questions about what was found during the examination.  If the procedure report does not answer your questions, please call your gastroenterologist to clarify.  If you requested that your care partner not be given the details of your procedure findings, then the procedure report has been included in a sealed envelope for you to review at your convenience later.  YOU SHOULD EXPECT: Some feelings of bloating in the abdomen. Passage of more gas than usual.  Walking can help get rid of the air that was put into your GI tract during the procedure and reduce the bloating. If you had a lower endoscopy (such as a colonoscopy or flexible sigmoidoscopy) you may notice spotting of blood in your stool or on the toilet paper. If you underwent a bowel prep for your procedure, you may not have a normal bowel movement for a few days.  Please Note:  You might notice some irritation and congestion in your nose or some drainage.  This is from the oxygen used during your procedure.  There is no need for concern and it should clear up in a day or so.  SYMPTOMS TO REPORT IMMEDIATELY:   Following lower endoscopy (colonoscopy or flexible sigmoidoscopy):  Excessive amounts of blood in the stool  Significant tenderness or worsening of abdominal pains  Swelling of the abdomen that is new, acute  Fever of 100F or higher   For urgent or emergent issues, a gastroenterologist can be reached at any hour by calling [phone].   DIET:  We do recommend a small meal at first, but then you may proceed to your regular diet.  Drink plenty of fluids but you should avoid alcoholic beverages for 24 hours.  MEDICATIONS: Continue present medications.  Please see handouts given to you by your recovery nurse.  ACTIVITY:  You should plan to take it easy for the rest of today and you should  NOT DRIVE or use heavy machinery until tomorrow (because of the sedation medicines used during the test).    FOLLOW UP: Our staff will call the number listed on your records 48-72 hours following your procedure to check on you and address any questions or concerns that you may have regarding the information given to you following your procedure. If we do not reach you, we will leave a message.  We will attempt to reach you two times.  During this call, we will ask if you have developed any symptoms of COVID 19. If you develop any symptoms (ie: fever, flu-like symptoms, shortness of breath, cough etc.) before then, please call [phone].  If you test positive for Covid 19 in the 2 weeks post procedure, please call and report this information to us.    If any biopsies were taken you will be contacted by phone or by letter within the next 1-3 weeks.  Please call us at [phone] if you have not heard about the biopsies in 3 weeks.   Thank you for allowing us to provide for your healthcare needs today.   SIGNATURES/CONFIDENTIALITY: You and/or your care partner have signed paperwork which will be entered into your electronic medical record.  These signatures attest to the fact that that the information above on your After Visit Summary has been reviewed and is understood.  Full responsibility of the confidentiality of this discharge information lies with you and/or   your care-partner. 

## 2019-07-30 NOTE — Progress Notes (Signed)
Temp-LC VS-CW  Pt's states no medical or surgical changes since previsit or office visit.  

## 2019-07-30 NOTE — Op Note (Signed)
[facility] Patient Name: Penny Barnes Procedure Date: 07/30/2019 8:36 AM MRN: [id number] Endoscopist: Ladene Artist , MD Age: 59 Referring MD:  Date of Birth: 12-07-1960 Gender: Female Account #: [id number] Procedure:                Colonoscopy Indications:              High risk colon cancer surveillance: Personal                            history of colon cancer Medicines:                Monitored Anesthesia Care Procedure:                Pre-Anesthesia Assessment:                           - Prior to the procedure, a History and Physical                            was performed, and patient medications and                            allergies were reviewed. The patient's tolerance of                            previous anesthesia was also reviewed. The risks                            and benefits of the procedure and the sedation                            options and risks were discussed with the patient.                            All questions were answered, and informed consent                            was obtained. Prior Anticoagulants: The patient has                            taken no previous anticoagulant or antiplatelet                            agents. ASA Grade Assessment: II - A patient with                            mild systemic disease. After reviewing the risks                            and benefits, the patient was deemed in                            satisfactory condition to undergo the procedure.  After obtaining informed consent, the colonoscope                            was passed under direct vision. Throughout the                            procedure, the patient's blood pressure, pulse, and                            oxygen saturations were monitored continuously. The                            Colonoscope was introduced through the anus and                            advanced to the the cecum, identified by                            appendiceal orifice and ileocecal valve. The                            ileocecal valve, appendiceal orifice, and rectum                            were photographed. The quality of the bowel                            preparation was good. The colonoscopy was performed                            without difficulty. The patient tolerated the                            procedure well. Scope In: 8:47:55 AM Scope Out: 9:02:20 AM Scope Withdrawal Time: 0 hours 11 minutes 30 seconds  Total Procedure Duration: 0 hours 14 minutes 25 seconds  Findings:                 The perianal and digital rectal examinations were                            normal.                           A single medium-mouthed diverticulum was found in                            the descending colon.                           There was evidence of a prior end-to-end                            colo-colonic anastomosis in the sigmoid colon. This  was patent and was characterized by healthy                            appearing mucosa. The anastomosis was traversed.                           Internal hemorrhoids were found during                            retroflexion. The hemorrhoids were small and Grade                            I (internal hemorrhoids that do not prolapse).                           The exam was otherwise without abnormality on                            direct and retroflexion views. Complications:            No immediate complications. Estimated blood loss:                            None. Estimated Blood Loss:     Estimated blood loss: none. Impression:               - Diverticulosis in the descending colon.                           - Patent end-to-end colo-colonic anastomosis,                            characterized by healthy appearing mucosa.                           - Internal hemorrhoids.                           - The examination was  otherwise normal on direct                            and retroflexion views.                           - No specimens collected. Recommendation:           - Repeat colonoscopy in 5 years for surveillance.                           - Patient has a contact number available for                            emergencies. The signs and symptoms of potential                            delayed complications were discussed with the  patient. Return to normal activities tomorrow.                            Written discharge instructions were provided to the                            patient.                           - Resume previous diet.                           - Continue present medications. Ladene Artist, MD 07/30/2019 9:05:40 AM This report has been signed electronically.

## 2019-08-03 ENCOUNTER — Telehealth: Payer: Self-pay | Admitting: *Deleted

## 2019-08-03 ENCOUNTER — Telehealth: Payer: Self-pay

## 2019-08-03 NOTE — Telephone Encounter (Signed)
Called [phone] and left a messaged we tried to reach pt for a follow up call. maw

## 2019-08-03 NOTE — Telephone Encounter (Signed)
1. Have you developed a fever since your procedure? no  2.   Have you had an respiratory symptoms (SOB or cough) since your procedure? no  3.   Have you tested positive for COVID 19 since your procedure no  4.   Have you had any family members/close contacts diagnosed with the COVID 19 since your procedure?  no   If yes to any of these questions please route to Joylene John, RN and Alphonsa Gin, Therapist, sports.  Follow up Call-  Call back number 07/30/2019  Post procedure Call Back phone  # [phone]  Permission to leave phone message Yes  Some recent data might be hidden     Patient questions:  Do you have a fever, pain , or abdominal swelling? No. Pain Score  0 *  Have you tolerated food without any problems? Yes.    Have you been able to return to your normal activities? Yes.    Do you have any questions about your discharge instructions: Diet   No. Medications  No. Follow up visit  No.  Do you have questions or concerns about your Care? No.  Actions: * If pain score is 4 or above: No action needed, pain <4.

## 2019-08-04 ENCOUNTER — Telehealth: Payer: Self-pay | Admitting: *Deleted

## 2019-08-04 NOTE — Telephone Encounter (Signed)
Yes I can see her  

## 2019-08-04 NOTE — Telephone Encounter (Signed)
Copied from [place] [id number]. Topic: Appointment Scheduling - Penny Patient >> Aug 04, 2019 10:09 AM Parke Poisson wrote: Penny Barnes would like to know if you will take her mother on as a Penny pt. She has Penny Barnes Medicare Provider: Dr Cherlynn Perches   Route to department's [facility] pool.

## 2019-08-04 NOTE — Telephone Encounter (Signed)
Spoke with daughter and informed her Sarajane Jews will see her mother. Appointment scheduled for 08/10/2019 at 9:30 AM

## 2019-08-05 ENCOUNTER — Telehealth: Payer: Self-pay | Admitting: *Deleted

## 2019-08-05 NOTE — Telephone Encounter (Signed)
Copied from [place] [id number]. Topic: Appointment Scheduling - New Patient >> Aug 04, 2019 10:09 AM Parke Poisson wrote: Azyah would like to know if you will take her mother on as a new pt. She has Leon[place] Medicare Provider: Dr Cherlynn Perches   Route to department's [facility] pool.

## 2019-08-05 NOTE — Telephone Encounter (Signed)
Yes I can see her  

## 2019-08-06 NOTE — Telephone Encounter (Signed)
Patient has been scheduled

## 2019-09-04 ENCOUNTER — Telehealth: Payer: Self-pay | Admitting: Oncology

## 2019-09-04 NOTE — Telephone Encounter (Signed)
Per 2/15 sch msg pt is aware of date and time

## 2019-09-21 ENCOUNTER — Telehealth: Payer: Self-pay | Admitting: *Deleted

## 2019-09-21 ENCOUNTER — Telehealth: Payer: Self-pay | Admitting: Oncology

## 2019-09-21 ENCOUNTER — Inpatient Hospital Stay: Payer: BC Managed Care – PPO | Admitting: Oncology

## 2019-09-21 ENCOUNTER — Other Ambulatory Visit: Payer: Self-pay

## 2019-09-21 ENCOUNTER — Inpatient Hospital Stay: Payer: BC Managed Care – PPO | Attending: Oncology

## 2019-09-21 ENCOUNTER — Other Ambulatory Visit: Payer: BLUE CROSS/BLUE SHIELD

## 2019-09-21 ENCOUNTER — Ambulatory Visit: Payer: BLUE CROSS/BLUE SHIELD | Admitting: Nurse Practitioner

## 2019-09-21 VITALS — BP 138/70 | HR 67 | Temp 98.2°F | Resp 18 | Ht 63.0 in | Wt 148.8 lb

## 2019-09-21 DIAGNOSIS — Z85038 Personal history of other malignant neoplasm of large intestine: Secondary | ICD-10-CM | POA: Insufficient documentation

## 2019-09-21 DIAGNOSIS — C187 Malignant neoplasm of sigmoid colon: Secondary | ICD-10-CM

## 2019-09-21 DIAGNOSIS — Z9221 Personal history of antineoplastic chemotherapy: Secondary | ICD-10-CM | POA: Diagnosis not present

## 2019-09-21 LAB — CEA (IN HOUSE-CHCC): CEA (CHCC-In House): <1 ng/mL (ref 0.00–5.00)

## 2019-09-21 NOTE — Telephone Encounter (Signed)
-----   Message from Ladell Pier, MD sent at 09/21/2019  1:56 PM EST ----- Please call patient, CEA is normal

## 2019-09-21 NOTE — Telephone Encounter (Signed)
Scheduled per los. Called and left msg. Mailed printout  °

## 2019-09-21 NOTE — Telephone Encounter (Signed)
Notified of normal CEA. 

## 2019-09-21 NOTE — Progress Notes (Signed)
  [facility] OFFICE PROGRESS NOTE   Diagnosis: Colon cancer  INTERVAL HISTORY:   Penny Barnes returns as scheduled.  She feels well.  Good appetite.  No complaint.  She underwent a colonoscopy on 1/14/202.  No polyps were removed.  She no longer has oxaliplatin neuropathy symptoms.  Objective:  Vital signs in last 24 hours:  Blood pressure 138/70, pulse 67, temperature 98.2 F (36.8 C), temperature source Temporal, resp. rate 18, height '5\' 3"'$  (1.6 m), weight 148 lb 12.8 oz (67.5 kg), SpO2 100 %.    Limited physical examination secondary to distancing with the Covid pandemic Lymphatics: No cervical, supraclavicular, axillary, or inguinal nodes GI: No hepatosplenomegaly, no mass, nontender Vascular: No leg edema    Lab Results:  Lab Results  Component Value Date   WBC 3.0 (L) 07/26/2014   HGB 13.9 07/26/2014   HCT 41.6 07/26/2014   MCV 89.1 07/26/2014   PLT 204 07/26/2014   NEUTROABS 1.7 07/26/2014    CMP  Lab Results  Component Value Date   NA 142 08/15/2016   K 4.5 08/15/2016   CL 104 09/16/2013   CO2 28 08/15/2016   GLUCOSE 95 08/15/2016   BUN 13.6 08/15/2016   CREATININE 0.7 08/15/2016   CALCIUM 9.6 08/15/2016   PROT 7.8 08/15/2016   ALBUMIN 4.2 08/15/2016   AST 17 08/15/2016   ALT 15 08/15/2016   ALKPHOS 87 08/15/2016   BILITOT 0.46 08/15/2016   GFRNONAA >90 09/16/2013   GFRAA >90 09/16/2013    Lab Results  Component Value Date   CEA1 1.02 09/22/2018    Medications: I have reviewed the patient's current medications.   Assessment/Plan: 1. 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  Cycle 1 adjuvant CAPOX 09/21/2013.   Cycle 2 adjuvant CAPOX 10/12/2013.   Cycle 3 adjuvant CAPOX 11/02/2013.   Cycle 4 adjuvant CAPOX 11/23/2013.   Cycle 5  adjuvant CAPOX 12/14/2013.   Cycle 6 adjuvant CAPOX 01/04/2014   Cycle 7 adjuvant CAPOX 01/25/2014.  Cycle 8 adjuvant CAPOX 02/15/2014.  Surveillance CT scans 07/26/2014-negative for recurrent disease.  Negative colonoscopy 08/05/2014. Next colonoscopy at a 2 year interval.  Surveillance CT scans 08/12/2015-negative for recurrent disease  Surveillance colonoscopy 08/06/2016-negative  Surveillance CT scans 08/15/2016-negative for metastatic disease  Colonoscopy 07/30/2019-negative 2. History of delayed nausea following cycle 1 CAPOX. Aloxi added with cycle 2. Emend and prophylactic Decadron added with cycle 3. 3. History of hand-foot syndrome secondary to Xeloda.  4. Diarrhea cycle 3 week 2. Xeloda dose reduced beginning with cycle 4. 5. Oxaliplatin neuropathy, persistent mild neuropathy symptoms. 6. Possible polyp in the gallbladder on the surveillance CT scan 08/12/2015.Abdominal ultrasound 09/01/2015 with several gallbladder wall polyps with the largest measuring 6.5 mm.Status post cholecystectomy 09/20/2015. Pathology showed chronic cholecystitis. No evidence of malignancy.   Disposition:  Penny Barnes is in remission from colon cancer.  We will follow-up on the CEA from today.  She would like to continue follow-up at the Cancer center.  She will return for an office visit and CEA in 1 year. Betsy Coder, MD  09/21/2019  8:48 AM

## 2019-10-12 ENCOUNTER — Ambulatory Visit: Payer: BC Managed Care – PPO | Attending: Internal Medicine

## 2019-10-12 DIAGNOSIS — Z23 Encounter for immunization: Secondary | ICD-10-CM

## 2019-10-12 NOTE — Progress Notes (Signed)
   Covid-19 Vaccination Clinic  Name:  Penny Barnes    MRN: [id number] DOB: 10/10/1960  10/12/2019  [name] was observed post Covid-19 immunization for 15 minutes without incident. She was provided with Vaccine Information Sheet and instruction to access the V-Safe system.   [name] was instructed to call 911 with any severe reactions post vaccine: Marland Kitchen Difficulty breathing  . Swelling of face and throat  . A fast heartbeat  . A bad rash all over body  . Dizziness and weakness   Immunizations Administered    Name Date Dose VIS Date Route   Pfizer COVID-19 Vaccine 10/12/2019  9:58 AM 0.3 mL 06/26/2019 Intramuscular   Manufacturer: [place]   Lot: [id number]   [place]: [id number]

## 2019-11-04 ENCOUNTER — Ambulatory Visit: Payer: BC Managed Care – PPO | Attending: Internal Medicine

## 2019-11-04 DIAGNOSIS — Z23 Encounter for immunization: Secondary | ICD-10-CM

## 2019-11-04 NOTE — Progress Notes (Signed)
   Covid-19 Vaccination Clinic  Name:  Penny Barnes    MRN: [id number] DOB: [birth year]  11/04/2019  [name] was observed post Covid-19 immunization for 15 minutes without incident. She was provided with Vaccine Information Sheet and instruction to access the V-Safe system.   [name] was instructed to call 911 with any severe reactions post vaccine: Marland Kitchen Difficulty breathing  . Swelling of face and throat  . A fast heartbeat  . A bad rash all over body  . Dizziness and weakness   Immunizations Administered    Name Date Dose VIS Date Route   Pfizer COVID-19 Vaccine 11/04/2019 11:20 AM 0.3 mL 09/09/2018 Intramuscular   Manufacturer: [place]   Lot: [id number]   [place]: [id number]

## 2020-05-09 DIAGNOSIS — H16302 Unspecified interstitial keratitis, left eye: Secondary | ICD-10-CM | POA: Diagnosis not present

## 2020-05-28 ENCOUNTER — Ambulatory Visit: Payer: BC Managed Care – PPO | Attending: Internal Medicine

## 2020-05-28 ENCOUNTER — Other Ambulatory Visit: Payer: Self-pay

## 2020-05-28 DIAGNOSIS — Z23 Encounter for immunization: Secondary | ICD-10-CM

## 2020-05-28 NOTE — Progress Notes (Signed)
   Covid-19 Vaccination Clinic  Name:  Penny Barnes    MRN: [id number] DOB: 1961-07-10  05/28/2020  [name] was observed post Covid-19 immunization for 15 minutes without incident. She was provided with Vaccine Information Sheet and instruction to access the V-Safe system.   [name] was instructed to call 911 with any severe reactions post vaccine: Marland Kitchen Difficulty breathing  . Swelling of face and throat  . A fast heartbeat  . A bad rash all over body  . Dizziness and weakness   Immunizations Administered    Name Date Dose VIS Date Route   Pfizer COVID-19 Vaccine 05/28/2020 12:06 PM 0.3 mL 05/04/2020 Intramuscular   Manufacturer: [place]   Lot: [id number]   [place]: [id number]

## 2020-09-19 ENCOUNTER — Other Ambulatory Visit: Payer: Self-pay

## 2020-09-19 ENCOUNTER — Inpatient Hospital Stay: Payer: BC Managed Care – PPO

## 2020-09-19 ENCOUNTER — Inpatient Hospital Stay: Payer: BC Managed Care – PPO | Attending: Nurse Practitioner | Admitting: Oncology

## 2020-09-19 ENCOUNTER — Telehealth: Payer: Self-pay | Admitting: Oncology

## 2020-09-19 VITALS — BP 127/66 | HR 79 | Temp 99.1°F | Resp 18 | Ht 63.0 in | Wt 143.5 lb

## 2020-09-19 DIAGNOSIS — Z9221 Personal history of antineoplastic chemotherapy: Secondary | ICD-10-CM | POA: Diagnosis not present

## 2020-09-19 DIAGNOSIS — Z85038 Personal history of other malignant neoplasm of large intestine: Secondary | ICD-10-CM | POA: Diagnosis not present

## 2020-09-19 DIAGNOSIS — C187 Malignant neoplasm of sigmoid colon: Secondary | ICD-10-CM

## 2020-09-19 DIAGNOSIS — Z9049 Acquired absence of other specified parts of digestive tract: Secondary | ICD-10-CM | POA: Insufficient documentation

## 2020-09-19 LAB — CEA (IN HOUSE-CHCC): CEA (CHCC-In House): <1 ng/mL (ref 0.00–5.00)

## 2020-09-19 NOTE — Progress Notes (Signed)
[facility] OFFICE PROGRESS NOTE   Diagnosis: Colon cancer  INTERVAL HISTORY:   Penny Barnes returns as scheduled.  She feels well.  Good appetite.  No difficulty with bowel function.  She reports intentional weight loss.  She has mild remaining neuropathy symptoms in the feet.  This does not interfere with activity.  Objective:  Vital signs in last 24 hours:  Blood pressure 127/66, pulse 79, temperature 99.1 F (37.3 C), temperature source Tympanic, resp. rate 18, height $RemoveBe'5\' 3"'aKrFQdIaP$  (1.6 m), weight 143 lb 8 oz (65.1 kg), SpO2 100 %.    Lymphatics: No cervical, supraclavicular, axillary, or inguinal nodes Resp: Lungs clear bilaterally Cardio: Regular rate and rhythm GI: No hepatosplenomegaly, no mass, nontender Vascular: No leg edema    Lab Results:   Lab Results  Component Value Date   CEA1 <1.00 09/21/2019    Medications: I have reviewed the patient's current medications.   Assessment/Plan: 1. 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  Cycle 1 adjuvant CAPOX 09/21/2013.   Cycle 2 adjuvant CAPOX 10/12/2013.   Cycle 3 adjuvant CAPOX 11/02/2013.   Cycle 4 adjuvant CAPOX 11/23/2013.   Cycle 5 adjuvant CAPOX 12/14/2013.   Cycle 6 adjuvant CAPOX 01/04/2014   Cycle 7 adjuvant CAPOX 01/25/2014.  Cycle 8 adjuvant CAPOX 02/15/2014.  Surveillance CT scans 07/26/2014-negative for recurrent disease.  Negative colonoscopy 08/05/2014. Next colonoscopy at a 2 year interval.  Surveillance CT scans 08/12/2015-negative for recurrent disease  Surveillance colonoscopy 08/06/2016-negative  Surveillance CT scans 08/15/2016-negative for metastatic disease  Colonoscopy 07/30/2019-negative 2. History of delayed nausea following cycle 1 CAPOX. Aloxi added with cycle 2.  Emend and prophylactic Decadron added with cycle 3. 3. History of hand-foot syndrome secondary to Xeloda.  4. Diarrhea cycle 3 week 2. Xeloda dose reduced beginning with cycle 4. 5. Oxaliplatin neuropathy, persistent mild neuropathy symptoms. 6. Possible polyp in the gallbladder on the surveillance CT scan 08/12/2015.Abdominal ultrasound 09/01/2015 with several gallbladder wall polyps with the largest measuring 6.5 mm.Status post cholecystectomy 09/20/2015. Pathology showed chronic cholecystitis. No evidence of malignancy.     Disposition: Penny Barnes is in remission from colon cancer.  We will follow up on the CEA from today.  She would like to continue follow-up with the Cancer center.  She will return for an office visit in 1 year.  She continues colonoscopy surveillance with Dr. Fuller Plan.  Betsy Coder, MD  09/19/2020  3:41 PM

## 2020-09-19 NOTE — Telephone Encounter (Signed)
Scheduled appointments per 3/7 los. Spoke to patient who is aware of appointments date and times.  

## 2020-09-20 ENCOUNTER — Telehealth: Payer: Self-pay

## 2020-09-20 NOTE — Telephone Encounter (Signed)
-----   Message from Ladell Pier, MD sent at 09/19/2020  4:51 PM EST ----- Please call patient, CEA is normal, follow-up as scheduled

## 2020-09-20 NOTE — Telephone Encounter (Signed)
Spoke with pt made aware of most recent lab results CEA is normal no new orders or concerns f/u as scheduled encouraged to call for any questions concerns or changes

## 2020-11-03 ENCOUNTER — Other Ambulatory Visit: Payer: Self-pay

## 2020-11-03 ENCOUNTER — Encounter: Payer: Self-pay | Admitting: Family Medicine

## 2020-11-03 MED ORDER — KETOCONAZOLE 2 % EX CREA: TOPICAL_CREAM | CUTANEOUS | 0 refills | Status: DC

## 2020-11-03 NOTE — Telephone Encounter (Signed)
Spoke with pt aware that a new Rx for Ketoconazole  cream was sent to her pharmacy. Verbalized understanding

## 2020-11-03 NOTE — Telephone Encounter (Signed)
This may be a little fungal infection. Call in Ketoconazole 2% cream to apply BID as needed, 30 grams with no rf

## 2021-09-19 ENCOUNTER — Inpatient Hospital Stay: Payer: BC Managed Care – PPO | Admitting: Oncology

## 2021-09-19 ENCOUNTER — Inpatient Hospital Stay: Payer: BC Managed Care – PPO | Attending: Family Medicine

## 2021-09-22 ENCOUNTER — Encounter: Payer: Self-pay | Admitting: *Deleted

## 2021-09-22 NOTE — Progress Notes (Signed)
"  No show" for 1 year f/u on 37//23. Scheduling message sent to reschedule for April. ?

## 2021-10-17 ENCOUNTER — Other Ambulatory Visit: Payer: Self-pay

## 2021-10-17 DIAGNOSIS — C187 Malignant neoplasm of sigmoid colon: Secondary | ICD-10-CM

## 2021-10-20 ENCOUNTER — Inpatient Hospital Stay: Payer: BC Managed Care – PPO | Admitting: Oncology

## 2021-10-20 ENCOUNTER — Inpatient Hospital Stay: Payer: BC Managed Care – PPO | Attending: Oncology

## 2021-10-20 VITALS — BP 151/61 | HR 67 | Temp 98.6°F | Resp 18 | Wt 126.8 lb

## 2021-10-20 DIAGNOSIS — Z85038 Personal history of other malignant neoplasm of large intestine: Secondary | ICD-10-CM | POA: Diagnosis not present

## 2021-10-20 DIAGNOSIS — T451X5D Adverse effect of antineoplastic and immunosuppressive drugs, subsequent encounter: Secondary | ICD-10-CM | POA: Insufficient documentation

## 2021-10-20 DIAGNOSIS — Z9221 Personal history of antineoplastic chemotherapy: Secondary | ICD-10-CM | POA: Diagnosis not present

## 2021-10-20 DIAGNOSIS — G620 Drug-induced polyneuropathy: Secondary | ICD-10-CM | POA: Insufficient documentation

## 2021-10-20 DIAGNOSIS — C187 Malignant neoplasm of sigmoid colon: Secondary | ICD-10-CM

## 2021-10-20 DIAGNOSIS — Z9049 Acquired absence of other specified parts of digestive tract: Secondary | ICD-10-CM | POA: Insufficient documentation

## 2021-10-20 LAB — CEA (ACCESS): CEA (CHCC): 1.32 ng/mL (ref 0.00–5.00)

## 2021-10-20 NOTE — Progress Notes (Signed)
?  [facility] ?OFFICE PROGRESS NOTE ? ? ?Diagnosis: Colon cancer ? ?INTERVAL HISTORY:  ? ?Penny Barnes returns as scheduled.  She feels well.  No difficulty with bowel or bladder function.  No bleeding.  She reports intentional weight loss with weight watchers. ? ?Objective: ? ?Vital signs in last 24 hours: ? ?Blood pressure (!) 140/58, pulse 69, temperature 98.6 ?F (37 ?C), resp. rate 16, weight 126 lb 12.8 oz (57.5 kg), SpO2 100 %. ?  ? ?Lymphatics: No cervical, supraclavicular, axillary, or inguinal nodes ?Resp: Lungs clear bilaterally ?Cardio: Regular rate and rhythm ?GI: No mass, no hepatosplenomegaly, nontender ?Vascular: No leg edema ?  ? ? ?Lab Results: ? ? ?Lab Results  ?Component Value Date  ? CEA1 <1.00 09/19/2020  ? CEA 1.32 10/20/2021  ? ? ?Medications: I have reviewed the patient's current medications. ? ? ?Assessment/Plan: ? 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 ?Cycle 1 adjuvant CAPOX 09/21/2013.   ?Cycle 2 adjuvant CAPOX 10/12/2013.   ?Cycle 3 adjuvant CAPOX 11/02/2013.   ?Cycle 4 adjuvant CAPOX 11/23/2013.   ?Cycle 5 adjuvant CAPOX 12/14/2013.   ?Cycle 6 adjuvant CAPOX 01/04/2014   ?Cycle 7 adjuvant CAPOX 01/25/2014. ?Cycle 8 adjuvant CAPOX 02/15/2014. ?Surveillance CT scans 07/26/2014-negative for recurrent disease. ?Negative colonoscopy 08/05/2014. Next colonoscopy at a 2 year interval. ?Surveillance CT scans 08/12/2015-negative for recurrent disease ?Surveillance colonoscopy 08/06/2016-negative ?Surveillance CT scans 08/15/2016-negative for metastatic disease ?Colonoscopy 07/30/2019-negative ?History of delayed nausea following cycle 1 CAPOX. Aloxi added with cycle 2. Emend and prophylactic Decadron added with cycle 3. ?History of hand-foot syndrome secondary to Xeloda.   ?Diarrhea cycle 3  week 2. Xeloda dose reduced beginning with cycle 4. ?5.    Oxaliplatin neuropathy, persistent mild neuropathy symptoms. ?6.   Possible polyp in the gallbladder on the surveillance CT scan 08/12/2015. Abdominal ultrasound 09/01/2015 with several gallbladder wall polyps with the largest measuring 6.5 mm. Status post cholecystectomy 09/20/2015. Pathology showed chronic cholecystitis. No evidence of malignancy. ?  ? ? ? ?Disposition: ?Penny Barnes remains in clinical remission from colon cancer.  She would like to continue follow-up at the Cancer center.  She will return for an office visit and CEA in 1 year. ?She will continue colonoscopy surveillance with Dr. Fuller Plan. ? ?Betsy Coder, MD ? ?10/20/2021  ?9:43 AM ? ? ?

## 2021-10-24 DIAGNOSIS — H18451 Nodular corneal degeneration, right eye: Secondary | ICD-10-CM | POA: Diagnosis not present

## 2021-10-27 ENCOUNTER — Encounter: Payer: Self-pay | Admitting: Family Medicine

## 2021-10-27 ENCOUNTER — Ambulatory Visit (INDEPENDENT_AMBULATORY_CARE_PROVIDER_SITE_OTHER): Payer: BC Managed Care – PPO | Admitting: Family Medicine

## 2021-10-27 VITALS — BP 110/76 | HR 63 | Temp 98.6°F | Ht 63.0 in | Wt 125.5 lb

## 2021-10-27 DIAGNOSIS — Z23 Encounter for immunization: Secondary | ICD-10-CM

## 2021-10-27 DIAGNOSIS — Z0000 Encounter for general adult medical examination without abnormal findings: Secondary | ICD-10-CM

## 2021-10-27 LAB — LIPID PANEL
Cholesterol: 144 mg/dL (ref 0–200)
HDL: 52.4 mg/dL (ref >39.00)
LDL Cholesterol: 82 mg/dL (ref 0–99)
NonHDL: 91.52
Total CHOL/HDL Ratio: 3
Triglycerides: 48 mg/dL (ref 0.0–149.0)
VLDL: 9.6 mg/dL (ref 0.0–40.0)

## 2021-10-27 LAB — HEMOGLOBIN A1C: Hgb A1c MFr Bld: 5.7 % (ref 4.6–6.5)

## 2021-10-27 LAB — CBC WITH DIFFERENTIAL/PLATELET
Basophils Absolute: 0 10*3/uL (ref 0.0–0.1)
Basophils Relative: 0.5 % (ref 0.0–3.0)
Eosinophils Absolute: 0 10*3/uL (ref 0.0–0.7)
Eosinophils Relative: 1.2 % (ref 0.0–5.0)
HCT: 44.9 % (ref 36.0–46.0)
Hemoglobin: 15.2 g/dL — ABNORMAL HIGH (ref 12.0–15.0)
Lymphocytes Relative: 29.5 % (ref 12.0–46.0)
Lymphs Abs: 1.1 10*3/uL (ref 0.7–4.0)
MCHC: 33.8 g/dL (ref 30.0–36.0)
MCV: 90.9 fl (ref 78.0–100.0)
Monocytes Absolute: 0.3 10*3/uL (ref 0.1–1.0)
Monocytes Relative: 7.7 % (ref 3.0–12.0)
Neutro Abs: 2.3 10*3/uL (ref 1.4–7.7)
Neutrophils Relative %: 61.1 % (ref 43.0–77.0)
Platelets: 193 10*3/uL (ref 150.0–400.0)
RBC: 4.94 Mil/uL (ref 3.87–5.11)
RDW: 12.9 % (ref 11.5–15.5)
WBC: 3.8 10*3/uL — ABNORMAL LOW (ref 4.0–10.5)

## 2021-10-27 LAB — HEPATIC FUNCTION PANEL
ALT: 15 U/L (ref 0–35)
AST: 19 U/L (ref 0–37)
Albumin: 4.5 g/dL (ref 3.5–5.2)
Alkaline Phosphatase: 66 U/L (ref 39–117)
Bilirubin, Direct: 0.1 mg/dL (ref 0.0–0.3)
Total Bilirubin: 0.5 mg/dL (ref 0.2–1.2)
Total Protein: 7.7 g/dL (ref 6.0–8.3)

## 2021-10-27 LAB — BASIC METABOLIC PANEL
BUN: 13 mg/dL (ref 6–23)
CO2: 31 mEq/L (ref 19–32)
Calcium: 9.6 mg/dL (ref 8.4–10.5)
Chloride: 103 mEq/L (ref 96–112)
Creatinine, Ser: 0.67 mg/dL (ref 0.40–1.20)
GFR: 93.87 mL/min (ref >60.00)
Glucose, Bld: 91 mg/dL (ref 70–99)
Potassium: 4.3 mEq/L (ref 3.5–5.1)
Sodium: 139 mEq/L (ref 135–145)

## 2021-10-27 LAB — TSH: TSH: 2.12 u[IU]/mL (ref 0.35–5.50)

## 2021-10-27 NOTE — Addendum Note (Signed)
Addended by: Wyvonne Lenz on: 10/27/2021 11:01 AM ? ? Modules accepted: Orders ? ?

## 2021-10-27 NOTE — Progress Notes (Signed)
? ?  Subjective:  ? ? Patient ID: Penny Barnes, female    DOB: [birth year], 61 y.o.   MRN: [id number] ? ?HPI ?Here for a well exam. She feels fine.  ? ? ?Review of Systems  ?Constitutional: Negative.   ?HENT: Negative.    ?Eyes: Negative.   ?Respiratory: Negative.    ?Cardiovascular: Negative.   ?Gastrointestinal: Negative.   ?Genitourinary:  Negative for decreased urine volume, difficulty urinating, dyspareunia, dysuria, enuresis, flank pain, frequency, hematuria, pelvic pain and urgency.  ?Musculoskeletal: Negative.   ?Skin: Negative.   ?Neurological: Negative.  Negative for headaches.  ?Psychiatric/Behavioral: Negative.    ? ?   ?Objective:  ? Physical Exam ?Constitutional:   ?   General: She is not in acute distress. ?   Appearance: Normal appearance. She is well-developed.  ?HENT:  ?   Head: Normocephalic and atraumatic.  ?   Right Ear: External ear normal.  ?   Left Ear: External ear normal.  ?   Nose: Nose normal.  ?   Mouth/Throat:  ?   Pharynx: No oropharyngeal exudate.  ?Eyes:  ?   General: No scleral icterus. ?   Conjunctiva/sclera: Conjunctivae normal.  ?   Pupils: Pupils are equal, round, and reactive to light.  ?Neck:  ?   Thyroid: No thyromegaly.  ?   Vascular: No JVD.  ?Cardiovascular:  ?   Rate and Rhythm: Normal rate and regular rhythm.  ?   Heart sounds: Normal heart sounds. No murmur heard. ?  No friction rub. No gallop.  ?Pulmonary:  ?   Effort: Pulmonary effort is normal. No respiratory distress.  ?   Breath sounds: Normal breath sounds. No wheezing or rales.  ?Chest:  ?   Chest wall: No tenderness.  ?Abdominal:  ?   General: Bowel sounds are normal. There is no distension.  ?   Palpations: Abdomen is soft. There is no mass.  ?   Tenderness: There is no abdominal tenderness. There is no guarding or rebound.  ?Musculoskeletal:     ?   General: No tenderness. Normal range of motion.  ?   Cervical back: Normal range of motion and neck supple.  ?Lymphadenopathy:  ?   Cervical: No cervical adenopathy.   ?Skin: ?   General: Skin is warm and dry.  ?   Findings: No erythema or rash.  ?Neurological:  ?   Mental Status: She is alert and oriented to person, place, and time.  ?   Cranial Nerves: No cranial nerve deficit.  ?   Motor: No abnormal muscle tone.  ?   Coordination: Coordination normal.  ?   Deep Tendon Reflexes: Reflexes are normal and symmetric. Reflexes normal.  ?Psychiatric:     ?   Behavior: Behavior normal.     ?   Thought Content: Thought content normal.     ?   Judgment: Judgment normal.  ? ? ? ? ? ?   ?Assessment & Plan:  ?Well exam. We discussed diet and exercise. Get fasting labs. ?Alysia Penna, MD ? ? ?

## 2021-11-27 DIAGNOSIS — H43812 Vitreous degeneration, left eye: Secondary | ICD-10-CM | POA: Diagnosis not present

## 2021-11-29 ENCOUNTER — Encounter: Payer: Self-pay | Admitting: Family Medicine

## 2021-12-04 ENCOUNTER — Other Ambulatory Visit: Payer: Self-pay

## 2021-12-04 MED ORDER — CIPROFLOXACIN HCL 500 MG PO TABS
ORAL_TABLET | ORAL | 0 refills | Status: DC
Start: 2021-12-04 — End: 2022-12-04

## 2021-12-04 NOTE — Telephone Encounter (Signed)
Call in Cipro 500 mg BID for 7 days  

## 2021-12-04 NOTE — Progress Notes (Addendum)
Cipro sent to [facility].

## 2022-01-17 ENCOUNTER — Telehealth (INDEPENDENT_AMBULATORY_CARE_PROVIDER_SITE_OTHER): Payer: BC Managed Care – PPO | Admitting: Internal Medicine

## 2022-01-17 ENCOUNTER — Encounter: Payer: Self-pay | Admitting: Internal Medicine

## 2022-01-17 DIAGNOSIS — R197 Diarrhea, unspecified: Secondary | ICD-10-CM | POA: Diagnosis not present

## 2022-01-17 MED ORDER — AZITHROMYCIN 500 MG PO TABS: 500.0000 mg | ORAL_TABLET | Freq: Every day | ORAL | 0 refills | Status: AC

## 2022-01-17 NOTE — Progress Notes (Signed)
   Virtual Visit via Telephone Note  I connected with@ on 01/17/22 at 12:30 PM EDT by telephone and verified that I am speaking with the correct person using two identifiers.   I discussed the limitations, risks, security and privacy concerns of performing an evaluation and management service by telephone and the limited availability of in person appointments. tThere may be a patient responsible charge related to this service. The patient expressed understanding and agreed to proceed.  Location patient: home Location provider: work  office Participants present for the call: patient, provider Patient did not have a visit in the prior 7 days to address this/these issue(s). Unable to get video link for virtual    History of Present Illness: Penny Barnes  presents with 3-4 days of acute diarrhea.  Onset acute vomit x 1 then watery frequent stools without blood  mild abd discomfort .  Last night eery few hours   tried immodium uyset w temp help.  But ongoing.  Husband had this began 2 weeks ago  and then her son  and now her they are better and received  medication cipro   .  No recent travel other exposures.   In May got diarrhea on return from [place] and rx with cipro x 3 day and got totally better until now.   Observations/Objective: Patient sounds personable and well on the phone. I do not appreciate any SOB. Speech and thought processing are grossly intact. Patient reported vitals: Lab Results  Component Value Date   WBC 3.8 (L) 10/27/2021   HGB 15.2 (H) 10/27/2021   HCT 44.9 10/27/2021   PLT 193.0 10/27/2021   GLUCOSE 91 10/27/2021   CHOL 144 10/27/2021   TRIG 48.0 10/27/2021   HDL 52.40 10/27/2021   LDLCALC 82 10/27/2021   ALT 15 10/27/2021   AST 19 10/27/2021   NA 139 10/27/2021   K 4.3 10/27/2021   CL 103 10/27/2021   CREATININE 0.67 10/27/2021   BUN 13 10/27/2021   CO2 31 10/27/2021   TSH 2.12 10/27/2021   HGBA1C 5.7 10/27/2021   Record review  Assessment and  Plan: Diarrhea of presumed infectious origin   Follow Up Instructions:  Can do empiric with azithro  but if viral will not be benefit ( such as norovirus)  Immodium as needed for comfort .   Fluids   Fu if  persistent or progressive  Antibiotics can cause diarrhea also ...   [id number] 5-10 [id number] 11-20 [id number] 21-30 I did not refer this patient for an OV in the next 24 hours for this/these issue(s).  I discussed the assessment and treatment plan with the patient. The patient was provided an opportunity to ask questions and answered. The patient agreed with the plan and demonstrated an understanding of the instructions.   The patient was advised to call back or seek an in-person evaluation if the symptoms worsen or if the condition fails to improve as anticipated.  I provided 12 minutes of non-face-to-face time during this encounter. Return if symptoms worsen or fail to improve as expected.  Shanon Ace, MD

## 2022-08-17 ENCOUNTER — Telehealth: Payer: Self-pay | Admitting: Family Medicine

## 2022-08-17 NOTE — Telephone Encounter (Signed)
Patient states on 01/17/22 She had a virtual visit with Dr. Regis Bill.  There was something wrong with the system so the nurse called and said Dr. Regis Bill would have to do a telephone visit.  It was coded as a consultation so the insurance company isn't wanting to pay for it.  The insurance company and the [employer] office said it needed to be recoded as a different type of visit and resubmitted.

## 2022-10-19 ENCOUNTER — Inpatient Hospital Stay: Payer: BC Managed Care – PPO | Attending: Family Medicine

## 2022-10-19 ENCOUNTER — Inpatient Hospital Stay: Payer: BC Managed Care – PPO | Admitting: Oncology

## 2022-10-31 ENCOUNTER — Encounter: Payer: Self-pay | Admitting: *Deleted

## 2022-10-31 NOTE — Progress Notes (Signed)
"  No show" for lab/OV On 10/19/22. Scheduling message sent to reschedule.

## 2022-11-28 ENCOUNTER — Other Ambulatory Visit: Payer: Self-pay | Admitting: *Deleted

## 2022-11-28 DIAGNOSIS — C187 Malignant neoplasm of sigmoid colon: Secondary | ICD-10-CM

## 2022-12-04 ENCOUNTER — Inpatient Hospital Stay: Payer: BC Managed Care – PPO | Admitting: Oncology

## 2022-12-04 ENCOUNTER — Telehealth: Payer: Self-pay

## 2022-12-04 ENCOUNTER — Inpatient Hospital Stay: Payer: BC Managed Care – PPO | Attending: Family Medicine

## 2022-12-04 VITALS — BP 136/67 | HR 71 | Temp 97.9°F | Resp 18 | Ht 63.0 in | Wt 123.0 lb

## 2022-12-04 DIAGNOSIS — Z85038 Personal history of other malignant neoplasm of large intestine: Secondary | ICD-10-CM | POA: Diagnosis not present

## 2022-12-04 DIAGNOSIS — C187 Malignant neoplasm of sigmoid colon: Secondary | ICD-10-CM

## 2022-12-04 LAB — CEA (ACCESS): CEA (CHCC): 1.05 ng/mL (ref 0.00–5.00)

## 2022-12-04 NOTE — Telephone Encounter (Signed)
-----   Message from Ladene Artist, MD sent at 12/04/2022  9:56 AM EDT ----- Please call patient, cea is normal, f/u as scheduled

## 2022-12-04 NOTE — [facility] Notes (Signed)
   [facility] OFFICE PROGRESS NOTE   Diagnosis: Colon cancer  INTERVAL HISTORY:   Penny Barnes returns as scheduled.  She feels well.  Good appetite.  No difficulty with bowel function.  No bleeding.  Her feet are "sensitive ", but otherwise no neuropathy symptoms.  Objective:  Vital signs in last 24 hours:  Blood pressure 136/67, pulse 71, temperature 97.9 F (36.6 C), temperature source Oral, resp. rate 18, height 5\' 3"  (1.6 m), weight 123 lb (55.8 kg), SpO2 100 %.     Lymphatics: No cervical, supraclavicular, axillary, or inguinal nodes Resp: Lungs clear bilaterally Cardio: Regular rate and rhythm GI: Nontender, no mass, no hepatosplenomegaly Vascular: No leg edema   Lab Results:  Lab Results  Component Value Date   WBC 3.8 (L) 10/27/2021   HGB 15.2 (H) 10/27/2021   HCT 44.9 10/27/2021   MCV 90.9 10/27/2021   PLT 193.0 10/27/2021   NEUTROABS 2.3 10/27/2021    CMP  Lab Results  Component Value Date   NA 139 10/27/2021   K 4.3 10/27/2021   CL 103 10/27/2021   CO2 31 10/27/2021   GLUCOSE 91 10/27/2021   BUN 13 10/27/2021   CREATININE 0.67 10/27/2021   CALCIUM 9.6 10/27/2021   PROT 7.7 10/27/2021   ALBUMIN 4.5 10/27/2021   AST 19 10/27/2021   ALT 15 10/27/2021   ALKPHOS 66 10/27/2021   BILITOT 0.5 10/27/2021   GFRNONAA >90 09/16/2013   GFRAA >90 09/16/2013    Lab Results  Component Value Date   CEA1 <1.00 09/19/2020   CEA 1.32 10/20/2021     Medications: I have reviewed the patient's current medications.   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 Cycle 1 adjuvant CAPOX 09/21/2013.   Cycle 2 adjuvant CAPOX 10/12/2013.   Cycle 3 adjuvant CAPOX 11/02/2013.   Cycle 4 adjuvant CAPOX 11/23/2013.   Cycle 5 adjuvant CAPOX 12/14/2013.    Cycle 6 adjuvant CAPOX 01/04/2014   Cycle 7 adjuvant CAPOX 01/25/2014. Cycle 8 adjuvant CAPOX 02/15/2014. Surveillance CT scans 07/26/2014-negative for recurrent disease. Negative colonoscopy 08/05/2014. Next colonoscopy at a 2 year interval. Surveillance CT scans 08/12/2015-negative for recurrent disease Surveillance colonoscopy 08/06/2016-negative Surveillance CT scans 08/15/2016-negative for metastatic disease Colonoscopy 07/30/2019-negative History of delayed nausea following cycle 1 CAPOX. Aloxi added with cycle 2. Emend and prophylactic Decadron added with cycle 3. History of hand-foot syndrome secondary to Xeloda.   Diarrhea cycle 3 week 2. Xeloda dose reduced beginning with cycle 4. 5.    Oxaliplatin neuropathy, persistent mild neuropathy symptoms. 6.   Possible polyp in the gallbladder on the surveillance CT scan 08/12/2015. Abdominal ultrasound 09/01/2015 with several gallbladder wall polyps with the largest measuring 6.5 mm. Status post cholecystectomy 09/20/2015. Pathology showed chronic cholecystitis. No evidence of malignancy.      Disposition: Penny Barnes remains in clinical remission from colon cancer.  We will follow-up on the CEA from today.  She would like to continue follow-up at the Cancer center.  She will return for an office visit and CEA in 1 year.  She will be due for a surveillance colonoscopy in January 2026.  Thornton Papas, MD  12/04/2022  8:34 AM

## 2022-12-04 NOTE — Telephone Encounter (Signed)
Patient gave verb understanding and had no further questions or concerns

## 2023-03-21 DIAGNOSIS — H18503 Unspecified hereditary corneal dystrophies, bilateral: Secondary | ICD-10-CM | POA: Diagnosis not present

## 2023-03-21 DIAGNOSIS — H2513 Age-related nuclear cataract, bilateral: Secondary | ICD-10-CM | POA: Diagnosis not present

## 2023-05-03 DIAGNOSIS — Z1151 Encounter for screening for human papillomavirus (HPV): Secondary | ICD-10-CM | POA: Diagnosis not present

## 2023-05-03 DIAGNOSIS — Z6821 Body mass index (BMI) 21.0-21.9, adult: Secondary | ICD-10-CM | POA: Diagnosis not present

## 2023-05-03 DIAGNOSIS — Z01419 Encounter for gynecological examination (general) (routine) without abnormal findings: Secondary | ICD-10-CM | POA: Diagnosis not present

## 2023-05-03 DIAGNOSIS — Z124 Encounter for screening for malignant neoplasm of cervix: Secondary | ICD-10-CM | POA: Diagnosis not present

## 2023-05-06 DIAGNOSIS — Z1231 Encounter for screening mammogram for malignant neoplasm of breast: Secondary | ICD-10-CM | POA: Diagnosis not present

## 2023-05-23 ENCOUNTER — Other Ambulatory Visit: Payer: Self-pay | Admitting: Obstetrics and Gynecology

## 2023-05-23 DIAGNOSIS — Z803 Family history of malignant neoplasm of breast: Secondary | ICD-10-CM

## 2023-06-22 ENCOUNTER — Ambulatory Visit
Admission: RE | Admit: 2023-06-22 | Discharge: 2023-06-22 | Disposition: A | Discharge status: Home / Self Care | Payer: BC Managed Care – PPO | Source: Ambulatory Visit | Attending: Obstetrics and Gynecology | Admitting: Obstetrics and Gynecology

## 2023-06-22 DIAGNOSIS — Z803 Family history of malignant neoplasm of breast: Secondary | ICD-10-CM

## 2023-06-22 DIAGNOSIS — Z1239 Encounter for other screening for malignant neoplasm of breast: Secondary | ICD-10-CM | POA: Diagnosis not present

## 2023-06-22 MED ORDER — GADOPICLENOL 0.5 MMOL/ML IV SOLN
6.0000 mL | Freq: Once | INTRAVENOUS | Status: AC | PRN
  Administered 2023-06-22: 6 mL via INTRAVENOUS

## 2023-07-29 ENCOUNTER — Encounter: Payer: Self-pay | Admitting: Physical Medicine & Rehabilitation

## 2023-08-05 DIAGNOSIS — Z13820 Encounter for screening for osteoporosis: Secondary | ICD-10-CM | POA: Diagnosis not present

## 2023-12-04 ENCOUNTER — Inpatient Hospital Stay: Payer: BC Managed Care – PPO

## 2023-12-04 ENCOUNTER — Inpatient Hospital Stay: Payer: BC Managed Care – PPO | Attending: Oncology | Admitting: Oncology

## 2023-12-04 ENCOUNTER — Inpatient Hospital Stay: Payer: Self-pay

## 2023-12-04 ENCOUNTER — Telehealth: Payer: Self-pay | Admitting: Oncology

## 2023-12-04 ENCOUNTER — Ambulatory Visit: Payer: Self-pay | Admitting: Oncology

## 2023-12-04 VITALS — BP 120/66 | HR 65 | Temp 98.2°F | Resp 18 | Ht 63.0 in | Wt 121.5 lb

## 2023-12-04 DIAGNOSIS — C187 Malignant neoplasm of sigmoid colon: Secondary | ICD-10-CM

## 2023-12-04 DIAGNOSIS — Z85038 Personal history of other malignant neoplasm of large intestine: Secondary | ICD-10-CM | POA: Insufficient documentation

## 2023-12-04 LAB — CEA (ACCESS): CEA (CHCC): <1 ng/mL (ref 0.00–5.00)

## 2023-12-04 NOTE — Telephone Encounter (Signed)
-----   Message from Coni Deep sent at 12/04/2023  3:03 PM EDT ----- Please call patient, the CEA is normal, follow-up as scheduled

## 2023-12-04 NOTE — Telephone Encounter (Signed)
 LEFT MESSAGE  WITH MOM TO CALL US  BACK FOR 1 YEAR FOLLOW UP.

## 2023-12-04 NOTE — Telephone Encounter (Signed)
 Patient gave verbal understanding and had no further questions.

## 2023-12-04 NOTE — Progress Notes (Signed)
  [facility] OFFICE PROGRESS NOTE   Diagnosis: Colon cancer  INTERVAL HISTORY:   Ms. Penny Barnes returns as scheduled.  She feels well.  She reports intentional weight loss.  No difficulty bowel function.  No bleeding.  She has mild "sensitivity "in the feet.  This does not interfere with activity.  Objective:  Vital signs in last 24 hours:  Blood pressure 120/66, pulse 65, temperature 98.2 F (36.8 C), temperature source Temporal, resp. rate 18, height 5\' 3"  (1.6 m), weight 121 lb 8 oz (55.1 kg), SpO2 100%.   Lymphatics: No cervical, supraclavicular, axillary, or inguinal nodes Resp: Lungs clear bilaterally Cardio: Regular rate and rhythm GI: Nontender, no mass, no hepatosplenomegaly Vascular: No leg edema  Lab Results:  Lab Results  Component Value Date   WBC 3.8 (L) 10/27/2021   HGB 15.2 (H) 10/27/2021   HCT 44.9 10/27/2021   MCV 90.9 10/27/2021   PLT 193.0 10/27/2021   NEUTROABS 2.3 10/27/2021    CMP  Lab Results  Component Value Date   NA 139 10/27/2021   K 4.3 10/27/2021   CL 103 10/27/2021   CO2 31 10/27/2021   GLUCOSE 91 10/27/2021   BUN 13 10/27/2021   CREATININE 0.67 10/27/2021   CALCIUM 9.6 10/27/2021   PROT 7.7 10/27/2021   ALBUMIN 4.5 10/27/2021   AST 19 10/27/2021   ALT 15 10/27/2021   ALKPHOS 66 10/27/2021   BILITOT 0.5 10/27/2021   GFRNONAA >90 09/16/2013   GFRAA >90 09/16/2013    Lab Results  Component Value Date   CEA1 <1.00 09/19/2020   CEA 1.05 12/04/2022     Medications: I have reviewed the patient's current medications.   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 Cycle 1 adjuvant CAPOX 09/21/2013.   Cycle 2 adjuvant CAPOX 10/12/2013.   Cycle 3 adjuvant CAPOX 11/02/2013.   Cycle 4 adjuvant CAPOX 11/23/2013.    Cycle 5 adjuvant CAPOX 12/14/2013.   Cycle 6 adjuvant CAPOX 01/04/2014   Cycle 7 adjuvant CAPOX 01/25/2014. Cycle 8 adjuvant CAPOX 02/15/2014. Surveillance CT scans 07/26/2014-negative for recurrent disease. Negative colonoscopy 08/05/2014. Next colonoscopy at a 2 year interval. Surveillance CT scans 08/12/2015-negative for recurrent disease Surveillance colonoscopy 08/06/2016-negative Surveillance CT scans 08/15/2016-negative for metastatic disease Colonoscopy 07/30/2019-negative History of delayed nausea following cycle 1 CAPOX. Aloxi  added with cycle 2. Emend and prophylactic Decadron  added with cycle 3. History of hand-foot syndrome secondary to Xeloda .   Diarrhea cycle 3 week 2. Xeloda  dose reduced beginning with cycle 4. 5.    Oxaliplatin  neuropathy, persistent mild neuropathy symptoms. 6.   Possible polyp in the gallbladder on the surveillance CT scan 08/12/2015. Abdominal ultrasound 09/01/2015 with several gallbladder wall polyps with the largest measuring 6.5 mm. Status post cholecystectomy 09/20/2015. Pathology showed chronic cholecystitis. No evidence of malignancy.       Disposition: Ms. [name] remains in clinical remission from colon cancer.  She is due for a colonoscopy in 2026.  We will follow-up on the CEA from today.  She would like to continue follow-up at the Cancer center.  She will return for an office visit in 1 year.  Coni Deep, MD  12/04/2023  9:00 AM

## 2024-05-26 DIAGNOSIS — Z6821 Body mass index (BMI) 21.0-21.9, adult: Secondary | ICD-10-CM | POA: Diagnosis not present

## 2024-05-26 DIAGNOSIS — Z01419 Encounter for gynecological examination (general) (routine) without abnormal findings: Secondary | ICD-10-CM | POA: Diagnosis not present

## 2024-07-03 ENCOUNTER — Ambulatory Visit

## 2024-07-03 VITALS — Ht 63.0 in | Wt 118.0 lb

## 2024-07-03 DIAGNOSIS — C189 Malignant neoplasm of colon, unspecified: Secondary | ICD-10-CM

## 2024-07-03 MED ORDER — NA SULFATE-K SULFATE-MG SULF 17.5-3.13-1.6 GM/177ML PO SOLN: 1.0000 | Freq: Once | ORAL | 0 refills | Status: AC

## 2024-07-03 NOTE — Progress Notes (Signed)
 Pt's name and DOB verified at the beginning of the pre-visit with 2 identifiers  Pt denies any difficulty with ambulating,sitting, laying down or rolling side to side  Pt has no issues moving head neck or swallowing  No egg or soy allergy known to patient   HX of PONV  No FH of Malignant Hyperthermia  Pt is not on home 02   Pt is not on blood thinners   Pt has frequent issues with constipation RN instructed pt to use Miralax per bottles instructions a week before prep days. Pt states they will  Pt is not on dialysis  Pt denise any abnormal heart rhythms   Pt denies any upcoming cardiac testing  Patient's chart reviewed by Norleen Schillings CNRA prior to pre-visit and patient appropriate for the [facility].  Pre-visit completed and red dot placed by patient's name on their procedure day (on provider's schedule).    Visit by phone  Pt states weight is 118 lb   Pt given  both [facility] main # and MD on call # prior to instructions.  Informed pt to come in at the time discussed and is shown on PV instructions.  Pt instructed to use Single[employer]com or GoodRx for a price reduction on prep  Instructed pt where to find PV instructions in My Chart. . Instructed pt on all aspects of written instructions including med holds clothing to wear and foods to eat and not eat as well as after procedure legal restrictions and to call MD on call if needed.. Pt states understanding. Instructed pt to review instructions again prior to procedure and call main # given if has any questions or any issues. Pt states they will.

## 2024-07-21 ENCOUNTER — Encounter: Payer: Self-pay | Admitting: Gastroenterology

## 2024-07-24 ENCOUNTER — Ambulatory Visit: Admitting: Gastroenterology

## 2024-07-24 ENCOUNTER — Encounter: Payer: Self-pay | Admitting: Gastroenterology

## 2024-07-24 VITALS — BP 106/55 | HR 66 | Temp 97.3°F | Resp 10 | Ht 63.0 in | Wt 118.0 lb

## 2024-07-24 DIAGNOSIS — D122 Benign neoplasm of ascending colon: Secondary | ICD-10-CM

## 2024-07-24 DIAGNOSIS — Z85038 Personal history of other malignant neoplasm of large intestine: Secondary | ICD-10-CM

## 2024-07-24 DIAGNOSIS — K635 Polyp of colon: Secondary | ICD-10-CM

## 2024-07-24 DIAGNOSIS — Z1211 Encounter for screening for malignant neoplasm of colon: Secondary | ICD-10-CM | POA: Diagnosis present

## 2024-07-24 DIAGNOSIS — K641 Second degree hemorrhoids: Secondary | ICD-10-CM

## 2024-07-24 DIAGNOSIS — C189 Malignant neoplasm of colon, unspecified: Secondary | ICD-10-CM

## 2024-07-24 DIAGNOSIS — Z860101 Personal history of adenomatous and serrated colon polyps: Secondary | ICD-10-CM | POA: Diagnosis not present

## 2024-07-24 MED ORDER — SODIUM CHLORIDE 0.9 % IV SOLN: 500.0000 mL | INTRAVENOUS | Status: AC

## 2024-07-24 NOTE — Progress Notes (Signed)
 Called to room to assist during endoscopic procedure.  Patient ID and intended procedure confirmed with present staff. Received instructions for my participation in the procedure from the performing physician.

## 2024-07-24 NOTE — Patient Instructions (Addendum)
 YOU HAD AN ENDOSCOPIC PROCEDURE TODAY AT THE [facility]:   Refer to the procedure report that was given to you for any specific questions about what was found during the examination.  If the procedure report does not answer your questions, please call your gastroenterologist to clarify.  If you requested that your care partner not be given the details of your procedure findings, then the procedure report has been included in a sealed envelope for you to review at your convenience later.  YOU SHOULD EXPECT: Some feelings of bloating in the abdomen. Passage of more gas than usual.  Walking can help get rid of the air that was put into your GI tract during the procedure and reduce the bloating. If you had a lower endoscopy (such as a colonoscopy or flexible sigmoidoscopy) you may notice spotting of blood in your stool or on the toilet paper. If you underwent a bowel prep for your procedure, you may not have a normal bowel movement for a few days.  Please Note:  You might notice some irritation and congestion in your nose or some drainage.  This is from the oxygen used during your procedure.  There is no need for concern and it should clear up in a day or so.  SYMPTOMS TO REPORT IMMEDIATELY:  Following lower endoscopy (colonoscopy or flexible sigmoidoscopy):  Excessive amounts of blood in the stool  Significant tenderness or worsening of abdominal pains  Swelling of the abdomen that is new, acute  Fever of 100F or higher   For urgent or emergent issues, a gastroenterologist can be reached at any hour by calling [phone]. Do not use MyChart messaging for urgent concerns.    DIET:  We do recommend a small meal at first, but then you may proceed to your regular diet.  Drink plenty of fluids but you should avoid alcoholic beverages for 24 hours. High Fiber Diet.  MEDICATIONS: Continue present medications. Use FiberCon 1-2 tablets by mouth daily.  FOLLOW UP: Await pathology  results. Repeat colonoscopy in 5 years for surveillance no matter pathology due to history.  Educational handouts given to patient: Polyps, Hemorrhoids, High Fiber Diet.  Thank you for allowing us  to provide for your healthcare needs today.  ACTIVITY:  You should plan to take it easy for the rest of today and you should NOT DRIVE or use heavy machinery until tomorrow (because of the sedation medicines used during the test).    FOLLOW UP: Our staff will call the number listed on your records the next business day following your procedure.  We will call around 7:15- 8:00 am to check on you and address any questions or concerns that you may have regarding the information given to you following your procedure. If we do not reach you, we will leave a message.     If any biopsies were taken you will be contacted by phone or by letter within the next 1-3 weeks.  Please call us  at [phone] if you have not heard about the biopsies in 3 weeks.    SIGNATURES/CONFIDENTIALITY: You and/or your care partner have signed paperwork which will be entered into your electronic medical record.  These signatures attest to the fact that that the information above on your After Visit Summary has been reviewed and is understood.  Full responsibility of the confidentiality of this discharge information lies with you and/or your care-partner.

## 2024-07-24 NOTE — Op Note (Signed)
 [facility] Patient Name: Penny Barnes Procedure Date: 07/24/2024 8:43 AM MRN: [id number] Endoscopist: Aloha Finner , MD, [id number] Age: 64 Referring MD:  Date of Birth: April 21, 1961 Gender: Female Account #: [id number] Procedure:                Colonoscopy Indications:              High risk colon cancer surveillance: Personal                            history of adenoma less than 10 mm in size, High                            risk colon cancer surveillance: Personal history of                            colon cancer Medicines:                Monitored Anesthesia Care Procedure:                Pre-Anesthesia Assessment:                           - Prior to the procedure, a History and Physical                            was performed, and patient medications and                            allergies were reviewed. The patient's tolerance of                            previous anesthesia was also reviewed. The risks                            and benefits of the procedure and the sedation                            options and risks were discussed with the patient.                            All questions were answered, and informed consent                            was obtained. Prior Anticoagulants: The patient has                            taken no anticoagulant or antiplatelet agents                            except for NSAID medication. ASA Grade Assessment:                            II - A patient with mild systemic disease. After  reviewing the risks and benefits, the patient was                            deemed in satisfactory condition to undergo the                            procedure.                           After obtaining informed consent, the colonoscope                            was passed under direct vision. Throughout the                            procedure, the patient's blood pressure, pulse, and                             oxygen saturations were monitored continuously. The                            Colonoscope was introduced through the anus and                            advanced to the the cecum, identified by                            appendiceal orifice and ileocecal valve. The                            colonoscopy was performed without difficulty. The                            patient tolerated the procedure. The quality of the                            bowel preparation was good. The ileocecal valve,                            appendiceal orifice, and rectum were photographed. Scope In: 9:03:13 AM Scope Out: 9:15:16 AM Scope Withdrawal Time: 0 hours 9 minutes 2 seconds  Total Procedure Duration: 0 hours 12 minutes 3 seconds  Findings:                 The digital rectal exam findings include                            hemorrhoids. Pertinent negatives include no                            palpable rectal lesions.                           A 5 mm polyp was found in the ascending colon. The  polyp was sessile. The polyp was removed with a                            cold snare. Resection and retrieval were complete.                           There was evidence of a prior end-to-end                            colo-colonic anastomosis in the recto-sigmoid                            colon. This was patent and was characterized by                            healthy appearing mucosa. The anastomosis was                            traversed.                           Normal mucosa was found in the entire colon                            otherwise.                           Non-bleeding non-thrombosed internal hemorrhoids                            were found during retroflexion, during perianal                            exam and during digital exam. The hemorrhoids were                            Grade II (internal hemorrhoids that prolapse but                             reduce spontaneously). Complications:            No immediate complications. Estimated Blood Loss:     Estimated blood loss was minimal. Impression:               - Hemorrhoids found on digital rectal exam.                           - One 5 mm polyp in the ascending colon, removed                            with a cold snare. Resected and retrieved.                           - Patent end-to-end colo-colonic anastomosis,  characterized by healthy appearing mucosa.                           - Normal mucosa in the entire examined colon                            otherwise.                           - Non-bleeding non-thrombosed internal hemorrhoids. Recommendation:           - The patient will be observed post-procedure,                            until all discharge criteria are met.                           - Discharge patient to home.                           - Patient has a contact number available for                            emergencies. The signs and symptoms of potential                            delayed complications were discussed with the                            patient. Return to normal activities tomorrow.                            Written discharge instructions were provided to the                            patient.                           - High fiber diet.                           - Use FiberCon 1-2 tablets PO daily.                           - Continue present medications.                           - Await pathology results.                           - Repeat colonoscopy in 5 years for surveillance no                            matter pathology.                           - The findings and recommendations were discussed  with the patient.                           - The findings and recommendations were discussed                            with the patient's family. Aloha Finner, MD 07/24/2024 9:19:16  AM

## 2024-07-24 NOTE — Progress Notes (Signed)
 "  GASTROENTEROLOGY PROCEDURE H&P NOTE   Primary Care Physician: Cloretta Arley NOVAK, MD  HPI: Penny Barnes is a 64 y.o. female who presents for High risk colon cancer surveillance colonoscopy.  Past Medical History:  Diagnosis Date   Anal fissure    Colon cancer (HCC)    Hemorrhage    rectum and anus   PONV (postoperative nausea and vomiting)    Vitiligo    Past Surgical History:  Procedure Laterality Date   ABDOMINAL HYSTERECTOMY     have one ovary   CHOLECYSTECTOMY  09/2015   COLON SURGERY     COLONOSCOPY  07/30/2019   per Dr. Aneita, internal hemorrhoids and diverticosis, no polyps, repeat in 5 yrs   LAPAROSCOPIC SIGMOID COLECTOMY N/A 08/25/2013   Procedure: LAPAROSCOPIC SIGMOID COLECTOMY;  Surgeon: Morene ONEIDA Olives, MD;  Location: [facility];  Service: General;  Laterality: N/A;   PORTACATH PLACEMENT Left 09/18/2013   Procedure: INSERTION PORT-A-CATH;  Surgeon: Morene ONEIDA Olives, MD;  Location: [facility];  Service: General;  Laterality: Left;   Current Outpatient Medications  Medication Sig Dispense Refill   ibuprofen (ADVIL) 100 MG/5ML suspension Take 200 mg by mouth as needed.     No current facility-administered medications for this visit.   Current Medications[1] Allergies[2] Family History  Problem Relation Age of Onset   Diverticulosis Mother    Kidney Stones Mother    Thyroid disease Mother    Diabetes Father    Heart disease Father    Cancer Sister        thyroid   Cancer Maternal Grandmother        breast   Colon cancer Neg Hx    Rectal cancer Neg Hx    Stomach cancer Neg Hx    Colon polyps Neg Hx    Esophageal cancer Neg Hx    Social History   Socioeconomic History   Marital status: Married    Spouse name: Not on file   Number of children: 1   Years of education: Not on file   Highest education level: Not on file  Occupational History   Occupation: manager/customer service    Employer: [employer]  Tobacco Use   Smoking status: Never    Smokeless tobacco: Never  Vaping Use   Vaping status: Never Used  Substance and Sexual Activity   Alcohol use: Yes    Alcohol/week: 0.0 standard drinks of alcohol    Comment: social   Drug use: No   Sexual activity: Not on file  Other Topics Concern   Not on file  Social History Narrative   Not on file   Social Drivers of Health   Tobacco Use: Low Risk (07/03/2024)   Patient History    Smoking Tobacco Use: Never    Smokeless Tobacco Use: Never    Passive Exposure: Not on file  Financial Resource Strain: Not on file  Food Insecurity: Not on file  Transportation Needs: Not on file  Physical Activity: Not on file  Stress: Not on file  Social Connections: Not on file  Intimate Partner Violence: Not on file  Depression (PHQ2-9): Low Risk (10/27/2021)   Depression (PHQ2-9)    PHQ-2 Score: 0  Alcohol Screen: Not on file  Housing: Not on file  Utilities: Not on file  Health Literacy: Not on file    Physical Exam: There were no vitals filed for this visit. There is no height or weight on file to calculate BMI. GEN: NAD EYE: Sclerae anicteric ENT:  MMM CV: Non-tachycardic GI: Soft, NT/ND NEURO:  Alert & Oriented x 3  Lab Results: No results for input(s): WBC, HGB, HCT, PLT in the last 72 hours. BMET No results for input(s): NA, K, CL, CO2, GLUCOSE, BUN, CREATININE, CALCIUM in the last 72 hours. LFT No results for input(s): PROT, ALBUMIN, AST, ALT, ALKPHOS, BILITOT, BILIDIR, IBILI in the last 72 hours. PT/INR No results for input(s): LABPROT, INR in the last 72 hours.   Impression / Plan: This is a 64 y.o.female  who presents for High risk colon cancer surveillance colonoscopy.  The risks and benefits of endoscopic evaluation/treatment were discussed with the patient and/or family; these include but are not limited to the risk of perforation, infection, bleeding, missed lesions, lack of diagnosis, severe illness requiring  hospitalization, as well as anesthesia and sedation related illnesses.  The patient's history has been reviewed, patient examined, no change in status, and deemed stable for procedure.  The patient and/or family was provided an opportunity to ask questions and all were answered.  The patient and/or family is agreeable to proceed.    Aloha Finner, MD [facility] Office # [phone]    [1]  Current Outpatient Medications:    ibuprofen (ADVIL) 100 MG/5ML suspension, Take 200 mg by mouth as needed., Disp: , Rfl:  [2]  Allergies Allergen Reactions   Aspirin Hives   "

## 2024-07-24 NOTE — Progress Notes (Signed)
 Report to PACU, RN, vss, BBS= Clear.

## 2024-07-24 NOTE — Progress Notes (Signed)
 Pt's states no medical or surgical changes since previsit or office visit.

## 2024-07-27 ENCOUNTER — Telehealth: Payer: Self-pay | Admitting: *Deleted

## 2024-07-27 NOTE — Telephone Encounter (Signed)
" °  Follow up Call-     07/24/2024    8:37 AM  Call back number  Post procedure Call Back phone  # [phone]  Permission to leave phone message Yes     Patient questions:  Do you have a fever, pain , or abdominal swelling? No. Pain Score  0 *  Have you tolerated food without any problems? Yes.    Have you been able to return to your normal activities? Yes.    Do you have any questions about your discharge instructions: Diet   No. Medications  No. Follow up visit  No.  Do you have questions or concerns about your Care? No.  Actions: * If pain score is 4 or above: No action needed, pain <4.   "

## 2024-07-28 LAB — SURGICAL PATHOLOGY

## 2024-08-07 ENCOUNTER — Ambulatory Visit: Payer: Self-pay | Admitting: Gastroenterology

## 2024-12-03 ENCOUNTER — Other Ambulatory Visit

## 2024-12-03 ENCOUNTER — Ambulatory Visit: Admitting: Oncology
